# Patient Record
Sex: Male | Born: 1955 | Hispanic: No | Smoking: Never smoker
Health system: Southern US, Community
[De-identification: ages and names within clinical notes are randomized; demographics above are authoritative.]

## PROBLEM LIST (undated history)

## (undated) ENCOUNTER — Emergency Department (HOSPITAL_COMMUNITY): Payer: Medicaid Other | Source: Home / Self Care

## (undated) DIAGNOSIS — R569 Unspecified convulsions: Secondary | ICD-10-CM

## (undated) DIAGNOSIS — F513 Sleepwalking [somnambulism]: Secondary | ICD-10-CM

## (undated) DIAGNOSIS — M1990 Unspecified osteoarthritis, unspecified site: Secondary | ICD-10-CM

---

## 2015-11-12 ENCOUNTER — Emergency Department (HOSPITAL_COMMUNITY)
Admission: EM | Admit: 2015-11-12 | Discharge: 2015-11-12 | Disposition: A | Discharge status: Home / Self Care | Payer: Self-pay | Attending: Emergency Medicine | Admitting: Emergency Medicine

## 2015-11-12 ENCOUNTER — Encounter (HOSPITAL_COMMUNITY): Payer: Self-pay | Admitting: *Deleted

## 2015-11-12 DIAGNOSIS — Z79899 Other long term (current) drug therapy: Secondary | ICD-10-CM | POA: Insufficient documentation

## 2015-11-12 DIAGNOSIS — M79652 Pain in left thigh: Secondary | ICD-10-CM | POA: Insufficient documentation

## 2015-11-12 HISTORY — DX: Sleepwalking (somnambulism): F51.3

## 2015-11-12 HISTORY — DX: Unspecified osteoarthritis, unspecified site: M19.90

## 2015-11-12 MED ORDER — INDOMETHACIN 50 MG PO CAPS: 50.0000 mg | ORAL_CAPSULE | Freq: Two times a day (BID) | ORAL | Status: DC | PRN

## 2015-11-12 NOTE — ED Provider Notes (Signed)
CSN: [id number]     Arrival date & time 11/12/15  1049 History  By signing my name below, I, Michael Shea, attest that this documentation has been prepared under the direction and in the presence of [facility] Pacey Altizer, PA-C. Electronically Signed: Phillis HaggisGabriella Shea, ED Scribe. 11/12/2015. 11:42 AM.   Chief Complaint  Patient presents with  . Groin Pain   The history is provided by the patient. No language interpreter was used.  HPI Comments: Michael Shea is a 60 y.o. male with a hx of arthritis who presents to the Emergency Department complaining of left groin pain onset 8 months ago. Pt has completed physical therapy for this pain to relief. He states that if he misses one day, the pain will come back and worsen. He is able to ambulate but states that it is painful. He states that it has been recommended that he get hip surgery but he does not think he will receive it. He last saw his PCP one month ago. Pt takes Indomethacin for his pain at home to relief, but he has since run out. He denies injury to the area, abdominal pain, joint swelling, rash, wound, weakness, or numbness.   Past Medical History  Diagnosis Date  . Sleep walking   . Arthritis    History reviewed. No pertinent past surgical history. No family history on file. Social History  Substance Use Topics  . Smoking status: Never Smoker   . Smokeless tobacco: None  . Alcohol Use: No    Review of Systems  Gastrointestinal: Negative for abdominal pain.  Musculoskeletal: Positive for myalgias and arthralgias. Negative for joint swelling.  Skin: Negative for rash and wound.  Neurological: Negative for weakness and numbness.   Allergies  Review of patient's allergies indicates no known allergies.  Home Medications   Prior to Admission medications   Medication Sig Start Date End Date Taking? Authorizing Provider  indomethacin (INDOCIN) 50 MG capsule Take 1 capsule (50 mg total) by mouth 2 (two) times daily as needed for moderate  pain. 11/12/15   Michael Hagood Pilcher Caymen Dubray, PA-C   BP 140/87 mmHg  Pulse 62  Temp(Src) 97.8 F (36.6 C) (Oral)  Resp 20  SpO2 99% Physical Exam  Constitutional: He is oriented to person, place, and time. He appears well-developed and well-nourished.  HENT:  Head: Normocephalic and atraumatic.  Neck: Normal range of motion. Neck supple.  Cardiovascular: Normal rate, regular rhythm and normal heart sounds.   Pulmonary/Chest: Effort normal and breath sounds normal. No respiratory distress.  Abdominal: Soft. He exhibits no distension. There is no tenderness.  Genitourinary:  Chaperone present during exam. No sign of inguinal hernia. No groin tenderness.  Musculoskeletal:       Legs: TTP as depicted in image. No erythema, ecchymosis, edema, or deformity appreciated. 5/5 muscle strength in bilateral LE's.   Neurological: He is alert and oriented to person, place, and time.  Bilateral lower extremities neurovascularly intact.   Skin: Skin is warm and dry.  Psychiatric: He has a normal mood and affect. His behavior is normal.  Nursing note and vitals reviewed.   ED Course  Procedures (including critical care time) DIAGNOSTIC STUDIES: Oxygen Saturation is 100% on RA, normal by my interpretation.    COORDINATION OF CARE: 11:42 AM-Discussed treatment plan which includes prescription for Indomethacin with pt at bedside and pt agreed to plan.    Labs Review Labs Reviewed - No data to display  Imaging Review No results found. I have personally reviewed and evaluated  these images and lab results as part of my medical decision-making.   EKG Interpretation None      MDM   Final diagnoses:  Pain of left thigh   Michael Shea presents to ED for "groin pain". On exam, patient's area of pain is more his anterior thigh. There is no tenderness to palpation of the groin and no sign of hernia. Likely muscular injury. Patient is ambulatory in the ED. He is already undergoing physical therapy for  his symptoms which she states is helping. He is requesting a refill of his indomethacin for pain relief, which I have filled today. I encouraged him to follow up with his regular doctor and discussed medication refills in ED. Return precautions discussed and all questions answered.  I personally performed the services described in this documentation, which was scribed in my presence. The recorded information has been reviewed and is accurate.     [facility] Michael Couillard, PA-C 11/12/15 1425  Michael Kaplan, MD 11/13/15 [phone]

## 2015-11-12 NOTE — ED Notes (Signed)
Pt c/o L groin pain onset x 8 mths, pt reports taking PT for this in Dec 2016, pt ambulatory with pain, pt denies swelling, denies injury, pt A&O x4

## 2015-11-12 NOTE — Discharge Instructions (Signed)
Take medication only as needed for severe pain.  Continue going to physical therapy.  Follow up with your primary care provider in regard's to today visit.  Return to ER for new or worsening symptoms, any additional concerns.

## 2015-12-03 ENCOUNTER — Ambulatory Visit: Payer: Self-pay | Attending: Internal Medicine | Admitting: Physician Assistant

## 2015-12-03 VITALS — BP 158/83 | HR 62 | Temp 98.4°F | Ht 69.0 in | Wt 226.8 lb

## 2015-12-03 DIAGNOSIS — M79652 Pain in left thigh: Secondary | ICD-10-CM | POA: Insufficient documentation

## 2015-12-03 DIAGNOSIS — M1990 Unspecified osteoarthritis, unspecified site: Secondary | ICD-10-CM | POA: Insufficient documentation

## 2015-12-03 DIAGNOSIS — Z79899 Other long term (current) drug therapy: Secondary | ICD-10-CM | POA: Insufficient documentation

## 2015-12-03 DIAGNOSIS — M25552 Pain in left hip: Secondary | ICD-10-CM

## 2015-12-03 DIAGNOSIS — Z9889 Other specified postprocedural states: Secondary | ICD-10-CM | POA: Insufficient documentation

## 2015-12-03 DIAGNOSIS — R569 Unspecified convulsions: Secondary | ICD-10-CM | POA: Insufficient documentation

## 2015-12-03 MED ORDER — MELOXICAM 15 MG PO TABS: 15.0000 mg | ORAL_TABLET | Freq: Every day | ORAL | 0 refills | Status: DC

## 2015-12-03 MED ORDER — THERA-GESIC 1-15 % EX CREA: 1.0000 | TOPICAL_CREAM | Freq: Two times a day (BID) | CUTANEOUS | 3 refills | Status: DC

## 2015-12-03 MED ORDER — SM CALCIUM/VITAMIN D3 600-800 MG-UNIT PO TABS: 1.0000 | ORAL_TABLET | Freq: Two times a day (BID) | ORAL | 1 refills | Status: DC

## 2015-12-03 MED ORDER — PHENYTOIN SODIUM EXTENDED 100 MG PO CAPS: 300.0000 mg | ORAL_CAPSULE | Freq: Every day | ORAL | 1 refills | Status: DC
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
## 2015-12-03 NOTE — Progress Notes (Signed)
Medication refill

## 2015-12-06 MED FILL — ?PHENYTOIN SOD EXT 100 MG C: 100 | 30 days supply | Qty: 90 | Fill #0

## 2015-12-13 ENCOUNTER — Encounter (HOSPITAL_COMMUNITY): Payer: Self-pay | Admitting: *Deleted

## 2015-12-13 ENCOUNTER — Emergency Department (HOSPITAL_COMMUNITY)
Admission: EM | Admit: 2015-12-13 | Discharge: 2015-12-13 | Disposition: A | Discharge status: Home / Self Care | Payer: Medicaid Other | Attending: Emergency Medicine | Admitting: Emergency Medicine

## 2015-12-13 DIAGNOSIS — Z79899 Other long term (current) drug therapy: Secondary | ICD-10-CM | POA: Insufficient documentation

## 2015-12-13 DIAGNOSIS — M25552 Pain in left hip: Secondary | ICD-10-CM

## 2015-12-13 MED FILL — MELOXICAM 15 MG TABLET: 15 | 90 days supply | Qty: 90 | Fill #0

## 2015-12-13 NOTE — ED Triage Notes (Signed)
PT reports on going Lt hip pain and needs surgery to hip.  Pt reports [place] moved from [place]. And needs a PCP.

## 2015-12-13 NOTE — ED Provider Notes (Signed)
[facility]-EMERGENCY DEPT Provider Note   CSN: [id number] Arrival date & time: 12/13/15  [id number]  First Provider Contact:  First MD Initiated Contact with Patient 12/13/15 0848        History   Chief Complaint No chief complaint on file.   HPI Dolan Amenlberto Rodden is a 60 y.o. male.  Pt comes in with c/o of ongoing left hip pain. [facility] and hasn't set up any care. He states that he was told that he need to have surgery but he opted to try physical therapy which was working well. He has not had a recent injury. Denies numbness or weakness   The history is provided by the patient. No language interpreter was used.    Past Medical History:  Diagnosis Date  . Arthritis   . Sleep walking     There are no active problems to display for this patient.   No past surgical history on file.     Home Medications    Prior to Admission medications   Medication Sig Start Date End Date Taking? Authorizing Provider  Calcium Carbonate-Vit D-Min (SM CALCIUM/VITAMIN D3) 600-800 MG-UNIT TABS Take 1 tablet by mouth 2 (two) times daily. 12/03/15   Anders SimmondsAngela M McClung, PA-C  meloxicam (MOBIC) 15 MG tablet Take 1 tablet (15 mg total) by mouth daily. 12/03/15   Anders SimmondsAngela M McClung, PA-C  Menthol-Methyl Salicylate (THERA-GESIC) 1-15 % CREA Apply 1 Dose topically 2 (two) times daily. 12/03/15   Anders SimmondsAngela M McClung, PA-C  phenytoin (DILANTIN) 100 MG ER capsule Take 3 capsules (300 mg total) by mouth at bedtime. 12/03/15   Anders SimmondsAngela M McClung, PA-C    Family History No family history on file.  Social History Social History  Substance Use Topics  . Smoking status: Never Smoker  . Smokeless tobacco: Never Used  . Alcohol use No     Allergies   Review of patient's allergies indicates no known allergies.   Review of Systems Review of Systems  All other systems reviewed and are negative.    Physical Exam Updated Vital Signs There were no vitals taken for this visit.  Physical Exam    Constitutional: He appears well-developed and well-nourished.  Cardiovascular: Normal rate.   Pulmonary/Chest: Effort normal and breath sounds normal.  Musculoskeletal: Normal range of motion.  Tender in the left lateral hip. Full rom. No swelling or deformity to the left leg  Neurological: He is alert.  Skin: Skin is warm.  Psychiatric: He has a normal mood and affect.     ED Treatments / Results  Labs (all labs ordered are listed, but only abnormal results are displayed) Labs Reviewed - No data to display  EKG  EKG Interpretation None       Radiology No results found.  Procedures Procedures (including critical care time)  Medications Ordered in ED Medications - No data to display   Initial Impression / Assessment and Plan / ED Course  I have reviewed the triage vital signs and the nursing notes.  Pertinent labs & imaging results that were available during my care of the patient were reviewed by me and considered in my medical decision making (see chart for details).  Clinical Course    Chronic pain. Discussed follow up with pcp or ortho  Final Clinical Impressions(s) / ED Diagnoses   Final diagnoses:  None    New Prescriptions New Prescriptions   No medications on file     Teressa LowerVrinda Weda Baumgarner, NP 12/13/15 [phone]  Raeford RazorStephen Kohut, MD 12/17/15 1122

## 2015-12-13 NOTE — ED Triage Notes (Signed)
[facility]  For orange card referral for PT .  Message left.

## 2015-12-16 ENCOUNTER — Encounter: Payer: Self-pay | Admitting: Physician Assistant

## 2015-12-16 ENCOUNTER — Ambulatory Visit: Payer: Self-pay | Attending: Internal Medicine | Admitting: Physician Assistant

## 2015-12-16 DIAGNOSIS — G8929 Other chronic pain: Secondary | ICD-10-CM | POA: Insufficient documentation

## 2015-12-16 DIAGNOSIS — M25552 Pain in left hip: Secondary | ICD-10-CM | POA: Insufficient documentation

## 2015-12-16 DIAGNOSIS — G40909 Epilepsy, unspecified, not intractable, without status epilepticus: Secondary | ICD-10-CM | POA: Insufficient documentation

## 2015-12-16 DIAGNOSIS — R569 Unspecified convulsions: Secondary | ICD-10-CM

## 2015-12-16 LAB — CBC WITH DIFFERENTIAL/PLATELET
BASOS PCT: 0 %
Basophils Absolute: 0 cells/uL (ref 0–200)
EOS ABS: 77 {cells}/uL (ref 15–500)
Eosinophils Relative: 1 %
HEMATOCRIT: 42.9 % (ref 38.5–50.0)
HEMOGLOBIN: 14.8 g/dL (ref 13.2–17.1)
LYMPHS ABS: 2079 {cells}/uL (ref 850–3900)
Lymphocytes Relative: 27 %
MCH: 31 pg (ref 27.0–33.0)
MCHC: 34.5 g/dL (ref 32.0–36.0)
MCV: 89.9 fL (ref 80.0–100.0)
MONO ABS: 770 {cells}/uL (ref 200–950)
MPV: 10.8 fL (ref 7.5–12.5)
Monocytes Relative: 10 %
NEUTROS ABS: 4774 {cells}/uL (ref 1500–7800)
Neutrophils Relative %: 62 %
Platelets: 246 10*3/uL (ref 140–400)
RBC: 4.77 MIL/uL (ref 4.20–5.80)
RDW: 14 % (ref 11.0–15.0)
WBC: 7.7 10*3/uL (ref 3.8–10.8)

## 2015-12-16 LAB — COMPREHENSIVE METABOLIC PANEL
ALK PHOS: 88 U/L (ref 40–115)
ALT: 14 U/L (ref 9–46)
AST: 15 U/L (ref 10–35)
Albumin: 4.3 g/dL (ref 3.6–5.1)
BILIRUBIN TOTAL: 0.3 mg/dL (ref 0.2–1.2)
BUN: 20 mg/dL (ref 7–25)
CALCIUM: 9.4 mg/dL (ref 8.6–10.3)
CO2: 21 mmol/L (ref 20–31)
Chloride: 107 mmol/L (ref 98–110)
Creat: 0.65 mg/dL — ABNORMAL LOW (ref 0.70–1.33)
GLUCOSE: 88 mg/dL (ref 65–99)
POTASSIUM: 4.3 mmol/L (ref 3.5–5.3)
Sodium: 139 mmol/L (ref 135–146)
TOTAL PROTEIN: 7.1 g/dL (ref 6.1–8.1)

## 2015-12-16 NOTE — Progress Notes (Signed)
Pt states he is here for the following:  Emergency Room follow up for Left hip pain Medication refill
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
## 2015-12-18 LAB — PHENYTOIN LEVEL, FREE AND TOTAL: Phenytoin, Free: 1.4 mg/L (ref 1.0–2.0)

## 2015-12-22 ENCOUNTER — Telehealth: Payer: Self-pay

## 2015-12-22 NOTE — Telephone Encounter (Signed)
Contacted pt to go over lab results pt did not answer lvm for pt to give me a call back at his earliest convenience  

## 2015-12-23 ENCOUNTER — Telehealth: Payer: Self-pay

## 2015-12-23 NOTE — Telephone Encounter (Signed)
Contacted pt to go over lab results pt did not answer lvm informing pt to give me a call back at the office also I will be mailing letter out today

## 2016-02-29 ENCOUNTER — Ambulatory Visit: Payer: Medicaid Other

## 2016-02-29 ENCOUNTER — Ambulatory Visit
Admission: EM | Admit: 2016-02-29 | Discharge: 2016-02-29 | Disposition: A | Discharge status: Home / Self Care | Payer: Medicaid Other | Attending: Family Medicine | Admitting: Family Medicine

## 2016-02-29 DIAGNOSIS — M25552 Pain in left hip: Secondary | ICD-10-CM

## 2016-02-29 DIAGNOSIS — M1652 Unilateral post-traumatic osteoarthritis, left hip: Secondary | ICD-10-CM

## 2016-02-29 DIAGNOSIS — M1612 Unilateral primary osteoarthritis, left hip: Secondary | ICD-10-CM | POA: Insufficient documentation

## 2016-02-29 MED ORDER — MELOXICAM 15 MG PO TABS: 15.0000 mg | ORAL_TABLET | Freq: Every day | ORAL | 0 refills | Status: DC

## 2016-02-29 NOTE — Discharge Instructions (Signed)
Recommend that you follow-up with an orthopedic surgeon for possible total hip replacement. In the meantime use Mobic for pain and also a cane or a crutch in the opposite hand to help relieve the pain in the left hip.

## 2016-02-29 NOTE — ED Triage Notes (Signed)
Pt c/o hip injury that has been reoccurring since a personal [place] about 2 years ago.
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
## 2016-03-13 MED FILL — ?PHENYTOIN SOD EXT 100 MG C: 100 | 30 days supply | Qty: 90 | Fill #1

## 2016-03-21 ENCOUNTER — Telehealth: Payer: Self-pay | Admitting: *Deleted

## 2016-03-21 NOTE — Telephone Encounter (Signed)
Called and left pt a message to inform him that his prescription is ready to be picked up and if he can pick it up by Monday 11/27 before 3pm.

## 2016-07-04 ENCOUNTER — Telehealth: Payer: Self-pay

## 2016-07-04 NOTE — Telephone Encounter (Signed)
TC  To  NP  At  [facility] regarding concerns  Voiced by  Case management. And resident. Client has on two occassions woke up at night  And had usual behaviors  As spitting on another clients  Bed and  Wiping it  Every where and on another  Occasion was walking I in the hallway with a sheet wrapped  Around him. When ask the  Next  Day what he  Was doing  He  Expressed  Sorrow  And couldn't remember the behavior. Is he sleep  Walking ?? Nurse called for  Advice on client ,NP will look at  Medications  Client is  Taking to see if they  Could be  Causing some behaviors.Client is on Dilantin ??  NP to follow  Up.

## 2016-07-12 NOTE — Congregational Nurse Program (Signed)
Congregational Nurse Program Note  Date of Encounter: 07/12/2016  Past Medical History: Past Medical History:  Diagnosis Date  . Arthritis   . Sleep walking     Encounter Details:     CNP Questionnaire - 07/12/16 2001      Patient Demographics   Is this a new or existing patient? Existing   Patient is considered a/an Not Applicable   Race Other     Patient Assistance   Location of Patient Assistance Not Applicable   Patient's financial/insurance status Self-Pay (Uninsured)   Uninsured Patient ([place] Card/Care Connects) Yes   Interventions Counseled to make appt. with provider   Patient referred to apply for the following financial assistance Not Applicable   Food insecurities addressed Not Applicable   Transportation assistance No   Assistance securing medications No   Educational health offerings Acute disease     Encounter Details   Primary purpose of visit Acute Illness/Condition Visit;Navigating the Healthcare System;Spiritual Care/Support Visit;Education/Health Concerns   Was an Emergency Department visit averted? Not Applicable   Does patient have a medical provider? Yes   Patient referred to Follow up with established PCP   Was a mental health screening completed? (GAINS tool) No   Does patient have dental issues? No   Does patient have vision issues? No   Does your patient have an abnormal blood pressure today? No   Since previous encounter, have you referred patient for abnormal blood pressure that resulted in a new diagnosis or medication change? No   Does your patient have an abnormal blood glucose today? No   Since previous encounter, have you referred patient for abnormal blood glucose that resulted in a new diagnosis or medication change? No   Was there a life-saving intervention made? No    Client in with  Questions regarding his  Tongue  Hurting ,states he ahs gargled  Several times but no better  For  Several days . Nurse looked at his tongue and it   appeared  Okay . Suggested he  Continue his  gargles if  not  better in a few days see his  PCP.

## 2016-08-02 NOTE — Congregational Nurse Program (Signed)
Congregational Nurse Program Note  Date of Encounter: 08/01/2016  Past Medical History: Past Medical History:  Diagnosis Date  . Arthritis   . Sleep walking     Encounter Details:     CNP Questionnaire - 08/02/16 1921      Patient Demographics   Is this a new or existing patient? Existing   Patient is considered a/an Not Applicable   Race Latino/Hispanic     Patient Assistance   Location of Patient Assistance Not Applicable   Patient's financial/insurance status Self-Pay (Uninsured)   Uninsured Patient ([place] Card/Care Connects) Yes   Interventions Counseled to make appt. with provider   Patient referred to apply for the following financial assistance Not Applicable   Food insecurities addressed Not Applicable   Transportation assistance No   Assistance securing medications No   Educational health offerings Behavioral health;Interpersonal relationships     Encounter Details   Primary purpose of visit Education/Health Concerns;Spiritual Care/Support Visit   Was an Emergency Department visit averted? Not Applicable   Does patient have a medical provider? Yes   Patient referred to Clinic;Follow up with established PCP   Was a mental health screening completed? (GAINS tool) No   Does patient have dental issues? No   Does patient have vision issues? No   Does your patient have an abnormal blood pressure today? No   Since previous encounter, have you referred patient for abnormal blood pressure that resulted in a new diagnosis or medication change? No   Does your patient have an abnormal blood glucose today? No   Since previous encounter, have you referred patient for abnormal blood glucose that resulted in a new diagnosis or medication change? No   Was there a life-saving intervention made? No    Nurse called client in to discuss his  Night time behaviors  And the need to see the NP at  [facility] regarding his behaviors that are disturbing to other residents. Client was very  co-operative and understood that  his  behaviors need to be addressed.  Client is on Dilantin  Which can cause some mental confusion?? Not sure what is  causing  client to wake up and night and start spitting  across the room and at other residents. . NP had agreed to see client ,referral made . Follow  As needed

## 2016-08-23 DIAGNOSIS — R42 Dizziness and giddiness: Secondary | ICD-10-CM

## 2016-08-23 LAB — GLUCOSE, POCT (MANUAL RESULT ENTRY)

## 2016-08-23 NOTE — Congregational Nurse Program (Signed)
Congregational Nurse Program Note  Date of Encounter: 08/23/2016  Past Medical History: Past Medical History:  Diagnosis Date  . Arthritis   . Sleep walking     Encounter Details:     CNP Questionnaire - 08/23/16 1854      Patient Demographics   Is this a new or existing patient? Existing   Patient is considered a/an Not Applicable   Race Latino/Hispanic     Patient Assistance   Location of Patient Assistance Not Applicable   Patient's financial/insurance status Self-Pay (Uninsured)   Uninsured Patient ([place] Card/Care Connects) Yes   Interventions Follow-up/Education/Support provided after completed appt.   Patient referred to apply for the following financial assistance Not Applicable   Food insecurities addressed Not Applicable   Transportation assistance No   Assistance securing medications No   Educational health offerings Hypertension;Medications     Encounter Details   Primary purpose of visit Education/Health Concerns;Spiritual Care/Support Visit   Was an Emergency Department visit averted? Not Applicable   Does patient have a medical provider? Yes   Patient referred to Clinic;Follow up with established PCP   Was a mental health screening completed? (GAINS tool) No   Does patient have dental issues? No   Does patient have vision issues? No   Does your patient have an abnormal blood pressure today? Yes   Since previous encounter, have you referred patient for abnormal blood pressure that resulted in a new diagnosis or medication change? No   Does your patient have an abnormal blood glucose today? No   Since previous encounter, have you referred patient for abnormal blood glucose that resulted in a new diagnosis or medication change? No   Was there a life-saving intervention made? No    In today stating he has been dizzy on several occassions  . States his  Father had  Diabetes . Pont's  to his  Stomach  May  Be something  There??  Did not  Go  To  See NP  For his   Other problems with  Spitting at  Night ,denies any  Of  Those problems. Blood  Sugar  Normal  , blood  Pressure puts  Him at  Hypertension.  To see NP  At  [facility]  In May  Not  Sure of  Date  But will call or  Stop by there to see. Will monitor  Here . Client is  About  To  Be  Housed per  Case managers. Counseled  Regarding  High blood  pressure

## 2016-10-02 ENCOUNTER — Ambulatory Visit: Payer: Medicaid Other | Admitting: Internal Medicine

## 2016-10-09 ENCOUNTER — Encounter: Payer: Self-pay | Admitting: Family Medicine

## 2016-10-09 ENCOUNTER — Ambulatory Visit: Payer: Self-pay | Attending: Internal Medicine | Admitting: Family Medicine

## 2016-10-09 VITALS — BP 126/76 | HR 84 | Temp 98.0°F | Resp 18 | Ht 67.0 in | Wt 226.0 lb

## 2016-10-09 DIAGNOSIS — M175 Other unilateral secondary osteoarthritis of knee: Secondary | ICD-10-CM

## 2016-10-09 DIAGNOSIS — F513 Sleepwalking [somnambulism]: Secondary | ICD-10-CM | POA: Insufficient documentation

## 2016-10-09 DIAGNOSIS — M1990 Unspecified osteoarthritis, unspecified site: Secondary | ICD-10-CM | POA: Insufficient documentation

## 2016-10-09 DIAGNOSIS — Z1321 Encounter for screening for nutritional disorder: Secondary | ICD-10-CM

## 2016-10-09 DIAGNOSIS — R569 Unspecified convulsions: Secondary | ICD-10-CM | POA: Insufficient documentation

## 2016-10-09 DIAGNOSIS — M1711 Unilateral primary osteoarthritis, right knee: Secondary | ICD-10-CM | POA: Insufficient documentation

## 2016-10-09 MED ORDER — MELOXICAM 15 MG PO TABS: 15.0000 mg | ORAL_TABLET | Freq: Every day | ORAL | 3 refills | Status: DC

## 2016-10-09 MED ORDER — PHENYTOIN SODIUM EXTENDED 100 MG PO CAPS
300.0000 mg | ORAL_CAPSULE | Freq: Every day | ORAL | 3 refills | Status: DC
Start: 2016-10-09 — End: 2017-02-27

## 2016-10-09 MED FILL — PHENYTOIN SOD EXT 100 MG CA: 100 | 30 days supply | Qty: 90 | Fill #0

## 2016-10-09 MED FILL — MELOXICAM 15 MG TABLET: 15 | 30 days supply | Qty: 30 | Fill #0

## 2016-10-09 NOTE — Progress Notes (Signed)
Patient is here for Arthritis   Patient complains of joint pain being present and scaled at a 9 currently.  Patient has taken medication today Patient has not eaten today.

## 2016-10-09 NOTE — Progress Notes (Signed)
Subjective:  Patient ID: Michael Shea, male    DOB: Oct 11, 1955  Age: 61 y.o. MRN: [id number]  CC: Arthritis   HPI Michael Shea is a 61 year old male with a history of seizures, osteoarthritis who was previously followed at the [facility] and presents today for follow-up visit. He endorses compliance with his Dilantin and states his last seizure was in 2004.  He complains of right knee pain as a result of running out of meloxicam which helps relieve his symptoms. He uses a knee brace continuously.  Past Medical History:  Diagnosis Date  . Arthritis   . Sleep walking     History reviewed. No pertinent surgical history.  No Known Allergies  Outpatient Medications Prior to Visit  Medication Sig Dispense Refill  . Menthol-Methyl Salicylate (THERA-GESIC) 1-15 % CREA Apply 1 Dose topically 2 (two) times daily. 1 Tube 3  . Calcium Carbonate-Vit D-Min (SM CALCIUM/VITAMIN D3) 600-800 MG-UNIT TABS Take 1 tablet by mouth 2 (two) times daily. 180 tablet 1  . meloxicam (MOBIC) 15 MG tablet Take 1 tablet (15 mg total) by mouth daily. 30 tablet 0  . phenytoin (DILANTIN) 100 MG ER capsule Take 3 capsules (300 mg total) by mouth at bedtime. 90 capsule 1   No facility-administered medications prior to visit.     ROS Review of Systems  Constitutional: Negative for activity change and appetite change.  HENT: Negative for sinus pressure and sore throat.   Eyes: Negative for visual disturbance.  Respiratory: Negative for cough, chest tightness and shortness of breath.   Cardiovascular: Negative for chest pain and leg swelling.  Gastrointestinal: Negative for abdominal distention, abdominal pain, constipation and diarrhea.  Endocrine: Negative.   Genitourinary: Negative for dysuria.  Musculoskeletal:       See hpi  Skin: Negative for rash.  Allergic/Immunologic: Negative.   Neurological: Negative for weakness, light-headedness and numbness.  Psychiatric/Behavioral: Negative for dysphoric mood and  suicidal ideas.    Objective:  BP 126/76 (BP Location: Left Arm, Patient Position: Sitting, Cuff Size: Normal)   Pulse 84   Temp 98 F (36.7 C) (Oral)   Resp 18   Ht _0  (1.702 m)   Wt 226 lb (102.5 kg)   SpO2 100%   BMI 35.40 kg/m   BP/Weight 10/09/2016 08/23/2016 93/73/4287  Systolic BP 681 157 262  Diastolic BP 76 81 68  Wt. (Lbs) 226 - 210  BMI 35.4 - 31.01      Physical Exam  Constitutional: He is oriented to person, place, and time. He appears well-developed and well-nourished.  Cardiovascular: Normal rate, normal heart sounds and intact distal pulses.   No murmur heard. Pulmonary/Chest: Effort normal and breath sounds normal. He has no wheezes. He has no rales. He exhibits no tenderness.  Abdominal: Soft. Bowel sounds are normal. He exhibits no distension and no mass. There is no tenderness.  Musculoskeletal: He exhibits tenderness (slight tenderness and crepitus on flexion and extension of the right knee joint; left is normal). He exhibits no edema.  Neurological: He is alert and oriented to person, place, and time.  Skin: Skin is warm and dry.  Psychiatric: He has a normal mood and affect.     Assessment & Plan:   1. Seizures (HCC) Stable - phenytoin (DILANTIN) 100 MG ER capsule; Take 3 capsules (300 mg total) by mouth at bedtime.  Dispense: 90 capsule; Refill: 3 - Lipid panel - CMP14+EGFR - Phenytoin level, free and total  2. Other secondary osteoarthritis of right knee Uncontrolled  Refill meloxicam Continue knee brace Advised that weight loss will improve symptoms - meloxicam (MOBIC) 15 MG tablet; Take 1 tablet (15 mg total) by mouth daily.  Dispense: 30 tablet; Refill: 3   Meds ordered this encounter  Medications  . meloxicam (MOBIC) 15 MG tablet    Sig: Take 1 tablet (15 mg total) by mouth daily.    Dispense:  30 tablet    Refill:  3  . phenytoin (DILANTIN) 100 MG ER capsule    Sig: Take 3 capsules (300 mg total) by mouth at bedtime.     Dispense:  90 capsule    Refill:  3    Follow-up: Return in about 3 months (around 01/09/2017) for Follow-up of chronic medical conditions.   This note has been created with Surveyor, quantity. Any transcriptional errors are unintentional.     Arnoldo Morale MD

## 2016-10-09 NOTE — Addendum Note (Signed)
Addended by: Jaclyn ShaggyAMAO, Eziah Negro on: 10/09/2016 02:49 PM   Modules accepted: Orders

## 2016-10-09 NOTE — Patient Instructions (Signed)

## 2016-10-10 ENCOUNTER — Other Ambulatory Visit: Payer: Self-pay | Admitting: Family Medicine

## 2016-10-10 LAB — VITAMIN B12: VITAMIN B 12: 254 pg/mL (ref 232–1245)

## 2016-10-10 LAB — VITAMIN D 25 HYDROXY (VIT D DEFICIENCY, FRACTURES): Vit D, 25-Hydroxy: 21.3 ng/mL — ABNORMAL LOW (ref 30.0–100.0)

## 2016-10-10 MED ORDER — ERGOCALCIFEROL 1.25 MG (50000 UT) PO CAPS: 50000.0000 [IU] | ORAL_CAPSULE | ORAL | 1 refills | Status: DC

## 2016-10-10 MED ORDER — ATORVASTATIN CALCIUM 20 MG PO TABS: 20.0000 mg | ORAL_TABLET | Freq: Every day | ORAL | 3 refills | Status: DC

## 2016-10-10 MED FILL — ?ATORVASTATIN 20 MG TABLET: 20 | 30 days supply | Qty: 30 | Fill #0

## 2016-10-10 MED FILL — VIT D2 1.25 MG (50,000 UNIT: 1.25 MG | 63 days supply | Qty: 9 | Fill #0

## 2016-10-11 ENCOUNTER — Telehealth: Payer: Self-pay | Admitting: Family Medicine

## 2016-10-11 MED ORDER — CETIRIZINE HCL 10 MG PO TABS: 10.0000 mg | ORAL_TABLET | Freq: Every day | ORAL | 1 refills | Status: DC

## 2016-10-11 MED FILL — ?CETIRIZINE HCL 10 MG TABLE: 10 | 30 days supply | Qty: 30 | Fill #0

## 2016-10-11 NOTE — Telephone Encounter (Signed)
Zyrtec sent to his pharmacy. 

## 2016-10-11 NOTE — Telephone Encounter (Signed)
Patient is requesting a prescription for allergy medicine. He states he communicated this with pcp during his office visit.  Please advised

## 2016-10-14 LAB — CMP14+EGFR
ALT: 19 IU/L (ref 0–44)
AST: 18 IU/L (ref 0–40)
Albumin/Globulin Ratio: 1.7 (ref 1.2–2.2)
Albumin: 4.8 g/dL (ref 3.5–5.5)
Alkaline Phosphatase: 126 IU/L — ABNORMAL HIGH (ref 39–117)
BUN/Creatinine Ratio: 21 — ABNORMAL HIGH (ref 9–20)
BUN: 15 mg/dL (ref 6–24)
Bilirubin Total: 0.4 mg/dL (ref 0.0–1.2)
CHLORIDE: 103 mmol/L (ref 96–106)
CO2: 23 mmol/L (ref 20–29)
Calcium: 9.5 mg/dL (ref 8.7–10.2)
Creatinine, Ser: 0.7 mg/dL — ABNORMAL LOW (ref 0.76–1.27)
GFR, EST AFRICAN AMERICAN: 119 mL/min/{1.73_m2} (ref >59)
GFR, EST NON AFRICAN AMERICAN: 103 mL/min/{1.73_m2} (ref >59)
GLUCOSE: 100 mg/dL — AB (ref 65–99)
Globulin, Total: 2.9 g/dL (ref 1.5–4.5)
Potassium: 4.5 mmol/L (ref 3.5–5.2)
Sodium: 140 mmol/L (ref 134–144)
TOTAL PROTEIN: 7.7 g/dL (ref 6.0–8.5)

## 2016-10-14 LAB — PHENYTOIN LEVEL, FREE AND TOTAL
Phenytoin, Free: 1 ug/mL (ref 1.0–2.0)
Phenytoin, Serum: 18.4 ug/mL (ref 10.0–20.0)

## 2016-10-14 LAB — LIPID PANEL
CHOL/HDL RATIO: 7 ratio — AB (ref 0.0–5.0)
Cholesterol, Total: 274 mg/dL — ABNORMAL HIGH (ref 100–199)
HDL: 39 mg/dL — ABNORMAL LOW (ref >39)
LDL Calculated: 192 mg/dL — ABNORMAL HIGH (ref 0–99)
Triglycerides: 217 mg/dL — ABNORMAL HIGH (ref 0–149)
VLDL Cholesterol Cal: 43 mg/dL — ABNORMAL HIGH (ref 5–40)

## 2016-10-17 NOTE — Congregational Nurse Program (Signed)
Congregational Nurse Program Note  Date of Encounter: 10/17/2016  Past Medical History: Past Medical History:  Diagnosis Date  . Arthritis   . Sleep walking     Encounter Details:     CNP Questionnaire - 10/17/16 1721      Patient Demographics   Is this a new or existing patient? Existing   Patient is considered a/an Not Applicable   Race Latino/Hispanic     Patient Assistance   Location of Patient Assistance Not Applicable   Patient's financial/insurance status Self-Pay (Uninsured)   Uninsured Patient ([place] Card/Care Connects) Yes   Interventions Counseled to make appt. with provider   Patient referred to apply for the following financial assistance [employer]/Care Connects   Transportation assistance Yes   Type of Assistance [employer] Given   Assistance securing medications No   Educational health offerings Behavioral health;Medications;Safety;Interpersonal relationships     Encounter Details   Primary purpose of visit Spiritual Care/Support Visit;Education/Health Concerns;Navigating the Healthcare System   Was an Emergency Department visit averted? Not Applicable   Does patient have a medical provider? Yes   Patient referred to Clinic;Area Agency   Was a mental health screening completed? (GAINS tool) No   Does patient have dental issues? No   Does patient have vision issues? No   Does your patient have an abnormal blood pressure today? No   Since previous encounter, have you referred patient for abnormal blood pressure that resulted in a new diagnosis or medication change? No   Does your patient have an abnormal blood glucose today? No   Since previous encounter, have you referred patient for abnormal blood glucose that resulted in a new diagnosis or medication change? No   Was there a life-saving intervention made? No         Clinical Intake - 10/09/16 1403      Pre-visit preparation   Pre-visit preparation completed Yes     Pain   Pain  0-10   Pain  Score 9    Pain Type Chronic pain   Pain Location Other (Comment)  joints   Pain Descriptors / Indicators Aching   Pain Onset More than a month ago   Pain Frequency Intermittent     Nutrition Screen   Diabetes No     Functional Status   Activities of Daily Living Independent   Ambulation Independent   Medication Administration Independent   Home Management Independent     Abuse/Neglect   Do you feel unsafe in your current relationship? No   Do you feel physically threatened by others? No   Anyone hurting you at home, work, or school? No   Unable to ask? No    Client referred to see nurse because of his  behaviors  During the night . Client appears to be sleep walking . Has gotten up at night walking down the hallway naked when approached becomes  Combative , in am doesn't admit to any  Behavior of that sort .Marland Kitchen. Just this past week end woke up in the bed with another resident playing with the residents feet. These behaviors ar causing conflicts with other residents and can present a safety risk for resident and client. I talked with client today he  States it is a problem ,he doesn't know what to do but realizes he shouldn't be  Doing that . [facility] ,given bus  Passes . Will follow up with  NP

## 2016-10-19 ENCOUNTER — Encounter: Payer: Self-pay | Admitting: Family Medicine

## 2016-10-23 ENCOUNTER — Ambulatory Visit: Payer: Self-pay | Attending: Family Medicine | Admitting: Family Medicine

## 2016-10-23 ENCOUNTER — Encounter: Payer: Self-pay | Admitting: Family Medicine

## 2016-10-23 VITALS — BP 154/69 | HR 61 | Temp 97.6°F | Wt 229.0 lb

## 2016-10-23 DIAGNOSIS — M1990 Unspecified osteoarthritis, unspecified site: Secondary | ICD-10-CM | POA: Insufficient documentation

## 2016-10-23 DIAGNOSIS — R569 Unspecified convulsions: Secondary | ICD-10-CM | POA: Insufficient documentation

## 2016-10-23 DIAGNOSIS — R42 Dizziness and giddiness: Secondary | ICD-10-CM | POA: Insufficient documentation

## 2016-10-23 DIAGNOSIS — Z79899 Other long term (current) drug therapy: Secondary | ICD-10-CM | POA: Insufficient documentation

## 2016-10-23 DIAGNOSIS — M79641 Pain in right hand: Secondary | ICD-10-CM | POA: Insufficient documentation

## 2016-10-23 DIAGNOSIS — R030 Elevated blood-pressure reading, without diagnosis of hypertension: Secondary | ICD-10-CM | POA: Insufficient documentation

## 2016-10-23 MED ORDER — PREDNISONE 20 MG PO TABS: 20.0000 mg | ORAL_TABLET | Freq: Two times a day (BID) | ORAL | 0 refills | Status: DC

## 2016-10-23 MED ORDER — MECLIZINE HCL 25 MG PO TABS: 25.0000 mg | ORAL_TABLET | Freq: Three times a day (TID) | ORAL | 1 refills | Status: DC | PRN

## 2016-10-23 MED FILL — ?PREDNISONE 20 MG TABLET: 20 | 5 days supply | Qty: 10 | Fill #0

## 2016-10-23 MED FILL — TRAVEL SICKNESS 25 MG TAB C: 25 | 20 days supply | Qty: 60 | Fill #0

## 2016-10-23 NOTE — Progress Notes (Signed)
Subjective:  Patient ID: Michael Shea, male    DOB: [birth year]  Age: 61 y.o. MRN: [id number]  CC: Hand pain  HPI Michael Amenlberto Strader s a 61 year old male with a history of seizures, osteoarthritis who [facility] and presents today for follow-up visit. He endorses compliance with his Dilantin and states his last seizure was in 2004.  He complains of a one-month history of right hand pain and inability to completely flex his right hand. He does have a history of underlying osteoarthritis of the knee for which he takes meloxicam with relief in symptoms. Denies swelling of his right hand, weakness or dropping things.  He also complains of dizziness which is worse on waking up in the morning and also associated with change in position. Denies ear pain, nausea or vomiting.    Past Medical History:  Diagnosis Date  . Arthritis   . Sleep walking     No past surgical history on file.  No Known Allergies   Outpatient Medications Prior to Visit  Medication Sig Dispense Refill  . atorvastatin (LIPITOR) 20 MG tablet Take 1 tablet (20 mg total) by mouth daily. 30 tablet 3  . cetirizine (ZYRTEC) 10 MG tablet Take 1 tablet (10 mg total) by mouth daily. 30 tablet 1  . ergocalciferol (DRISDOL) 50000 units capsule Take 1 capsule (50,000 Units total) by mouth once a week. 9 capsule 1  . meloxicam (MOBIC) 15 MG tablet Take 1 tablet (15 mg total) by mouth daily. 30 tablet 3  . Menthol-Methyl Salicylate (THERA-GESIC) 1-15 % CREA Apply 1 Dose topically 2 (two) times daily. 1 Tube 3  . phenytoin (DILANTIN) 100 MG ER capsule Take 3 capsules (300 mg total) by mouth at bedtime. 90 capsule 3   No facility-administered medications prior to visit.     ROS Review of Systems  Constitutional: Negative for activity change and appetite change.  HENT: Negative for sinus pressure and sore throat.   Eyes: Negative for visual disturbance.  Respiratory: Negative for cough, chest tightness and  shortness of breath.   Cardiovascular: Negative for chest pain and leg swelling.  Gastrointestinal: Negative for abdominal distention, abdominal pain, constipation and diarrhea.  Endocrine: Negative.   Genitourinary: Negative for dysuria.  Musculoskeletal:       See history of present illness  Skin: Negative for rash.  Allergic/Immunologic: Negative.   Neurological: Positive for light-headedness. Negative for weakness and numbness.  Psychiatric/Behavioral: Negative for dysphoric mood and suicidal ideas.    Objective:  BP (!) 154/69   Pulse 61   Temp 97.6 F (36.4 C) (Oral)   Wt 229 lb (103.9 kg)   SpO2 99%   BMI 35.87 kg/m   BP/Weight 10/23/2016 10/09/2016 08/23/2016  Systolic BP 154 126 146  Diastolic BP 69 76 81  Wt. (Lbs) 229 226 -  BMI 35.87 35.4 -      Physical Exam  Constitutional: He is oriented to person, place, and time. He appears well-developed and well-nourished.  Cardiovascular: Normal rate, normal heart sounds and intact distal pulses.   No murmur heard. Pulmonary/Chest: Effort normal and breath sounds normal. He has no wheezes. He has no rales. He exhibits no tenderness.  Abdominal: Soft. Bowel sounds are normal. He exhibits no distension and no mass. There is no tenderness.  Musculoskeletal:  unable to make a fist in the right hand with inability to completely flex the medial 2 fingers; attention is normal  Neurological: He is alert and oriented to person, place,  and time.  Skin: Skin is warm and dry.  Psychiatric: He has a normal mood and affect.   Assessment & Plan:   1. Pain of right hand Likely underlying osteoarthritis Placed on short course of prednisone Continue meloxicam after prednisone is completed Reassess at next visit  2. Vertigo Placed on meclizine  3. Elevated blood pressure reading in office without diagnosis of hypertension  possibly NSAID induced We'll reassess at next visit Low-sodium diet   Meds ordered this encounter    Medications  . predniSONE (DELTASONE) 20 MG tablet    Sig: Take 1 tablet (20 mg total) by mouth 2 (two) times daily with a meal.    Dispense:  10 tablet    Refill:  0  . meclizine (ANTIVERT) 25 MG tablet    Sig: Take 1 tablet (25 mg total) by mouth 3 (three) times daily as needed.    Dispense:  60 tablet    Refill:  1    Follow-up: Return in about 1 month (around 11/22/2016) for Follow-up of vertigo and blood pressure.   Jaclyn Shaggy MD

## 2016-10-23 NOTE — Patient Instructions (Signed)
Dizziness Dizziness is a common problem. It is a feeling of unsteadiness or light-headedness. You may feel like you are about to faint. Dizziness can lead to injury if you stumble or fall. Anyone can become dizzy, but dizziness is more common in older adults. This condition can be caused by a number of things, including medicines, dehydration, or illness. Follow these instructions at home: Taking these steps may help with your condition: Eating and drinking   Drink enough fluid to keep your urine clear or pale yellow. This helps to keep you from becoming dehydrated. Try to drink more clear fluids, such as water.  Do not drink alcohol.  Limit your caffeine intake if directed by your health care provider.  Limit your salt intake if directed by your health care provider. Activity   Avoid making quick movements.  Rise slowly from chairs and steady yourself until you feel okay.  In the morning, first sit up on the side of the bed. When you feel okay, stand slowly while you hold onto something until you know that your balance is fine.  Move your legs often if you need to stand in one place for a long time. Tighten and relax your muscles in your legs while you are standing.  Do not drive or operate heavy machinery if you feel dizzy.  Avoid bending down if you feel dizzy. Place items in your home so that they are easy for you to reach without leaning over. Lifestyle   Do not use any tobacco products, including cigarettes, chewing tobacco, or electronic cigarettes. If you need help quitting, ask your health care provider.  Try to reduce your stress level, such as with yoga or meditation. Talk with your health care provider if you need help. General instructions   Watch your dizziness for any changes.  Take medicines only as directed by your health care provider. Talk with your health care provider if you think that your dizziness is caused by a medicine that you are taking.  Tell a friend  or a family member that you are feeling dizzy. If he or she notices any changes in your behavior, have this person call your health care provider.  Keep all follow-up visits as directed by your health care provider. This is important. Contact a health care provider if:  Your dizziness does not go away.  Your dizziness or light-headedness gets worse.  You feel nauseous.  You have reduced hearing.  You have new symptoms.  You are unsteady on your feet or you feel like the room is spinning. Get help right away if:  You vomit or have diarrhea and are unable to eat or drink anything.  You have problems talking, walking, swallowing, or using your arms, hands, or legs.  You feel generally weak.  You are not thinking clearly or you have trouble forming sentences. It may take a friend or family member to notice this.  You have chest pain, abdominal pain, shortness of breath, or sweating.  Your vision changes.  You notice any bleeding.  You have a headache.  You have neck pain or a stiff neck.  You have a fever. This information is not intended to replace advice given to you by your health care provider. Make sure you discuss any questions you have with your health care provider. Document Released: 10/11/2000 Document Revised: 09/23/2015 Document Reviewed: 04/13/2014 Elsevier Interactive Patient Education  2017 [employer]  

## 2016-10-24 NOTE — Congregational Nurse Program (Signed)
Congregational Nurse Program Note  Date of Encounter: 10/18/2016  Past Medical History: Past Medical History:  Diagnosis Date  . Arthritis   . Sleep walking     Encounter Details:     CNP Questionnaire - 10/24/16 1839      Patient Demographics   Is this a new or existing patient? Existing   Patient is considered a/an Not Applicable   Race Latino/Hispanic     Patient Assistance   Location of Patient Assistance Not Applicable   Patient's financial/insurance status Self-Pay (Uninsured)   Uninsured Patient ([place] [facility]/Care Connects) Yes   Interventions Follow-up/Education/Support provided after completed appt.;Counseled to make appt. with provider;Assisted patient in making appt.   Patient referred to apply for the following financial assistance Not Applicable   Food insecurities addressed Not Applicable   Transportation assistance Yes   Type of Assistance Bus Pass Given   Assistance securing medications No   Educational health offerings Behavioral health;Interpersonal relationships;Medications     Encounter Details   Primary purpose of visit Acute Illness/Condition Visit;Spiritual Care/Support Visit;Education/Health Concerns   Was an Emergency Department visit averted? Not Applicable   Does patient have a medical provider? Yes   Patient referred to Clinic;Area Agency   Was a mental health screening completed? (GAINS tool) No   Does patient have dental issues? No   Does patient have vision issues? No   Does your patient have an abnormal blood pressure today? No   Since previous encounter, have you referred patient for abnormal blood pressure that resulted in a new diagnosis or medication change? No   Does your patient have an abnormal blood glucose today? No   Since previous encounter, have you referred patient for abnormal blood glucose that resulted in a new diagnosis or medication change? No   Was there a life-saving intervention made? No         Clinical Intake -  10/23/16 1038      Pre-visit preparation   Pre-visit preparation completed Yes     Pain   Pain  0-10   Pain Score 9    Pain Location Hand   Pain Orientation Right     Nutrition Screen   Diabetes No     Functional Status   Activities of Daily Living Independent   Ambulation Independent   Medication Administration Independent   Home Management Independent     Risk/Barriers   Barriers to Care Management & Learning None     Abuse/Neglect   Do you feel unsafe in your current relationship? No   Do you feel physically threatened by others? No   Anyone hurting you at home, work, or school? No   Unable to ask? No     Web designerLanguage Assistant   Interpreter Needed? No     Nurse made a referral to NP to see client  Regarding his sleeping problems ,getting up at night not  Remembering his actions  and disturbing other residents  ,he states he went to see NP today  But  was t too late ,plans to return tomorrow ,requesting bus tickets . Counseled on importance of  going to see NP regarding his sleeping problems and the problems it is causing other residents when he'" sleep walks ",he understands this cant keep occurring. . Not sure what needs to be done for client . Will follow up with client.

## 2016-10-30 ENCOUNTER — Ambulatory Visit: Payer: Medicaid Other

## 2016-11-09 ENCOUNTER — Ambulatory Visit: Payer: Self-pay | Attending: Family Medicine

## 2016-11-15 NOTE — Congregational Nurse Program (Signed)
Congregational Nurse Program Note  Date of Encounter: 11/15/2016  Past Medical History: Past Medical History:  Diagnosis Date  . Arthritis   . Sleep walking     Encounter Details:     CNP Questionnaire - 11/15/16 1909      Patient Demographics   Is this a new or existing patient? Existing   Patient is considered a/an Not Applicable   Race Native [place]     Patient Assistance   Location of Patient Assistance Not Applicable   Patient's financial/insurance status Self-Pay (Uninsured)   Uninsured Patient ([place] Card/Care Connects) Yes   Interventions Counseled to make appt. with provider   Patient referred to apply for the following financial assistance Not Applicable   Food insecurities addressed Not Applicable   Transportation assistance No   Assistance securing medications No   Educational health offerings Medications     Encounter Details   Primary purpose of visit Education/Health Concerns   Was an Emergency Department visit averted? Not Applicable   Does patient have a medical provider? Yes   Patient referred to Clinic;Follow up with established PCP   Was a mental health screening completed? (GAINS tool) No   Does patient have dental issues? No   Does patient have vision issues? No   Does your patient have an abnormal blood pressure today? No   Since previous encounter, have you referred patient for abnormal blood pressure that resulted in a new diagnosis or medication change? No   Does your patient have an abnormal blood glucose today? No   Since previous encounter, have you referred patient for abnormal blood glucose that resulted in a new diagnosis or medication change? No   Was there a life-saving intervention made? No     Client in asking for antifungal cream?? When ask what  for he states antifungal cream ?? [facility].Then stated I have to buy over the counter , nurse explained her role in securing  medications he then stated u cant buy .  Client also ask for band aids and was given several and left .

## 2016-11-16 MED FILL — ?PHENYTOIN SOD EXT 100 MG c: 100 | 30 days supply | Qty: 90 | Fill #1

## 2016-11-27 ENCOUNTER — Ambulatory Visit: Payer: Self-pay | Attending: Family Medicine | Admitting: Family Medicine

## 2016-11-27 ENCOUNTER — Encounter: Payer: Self-pay | Admitting: Family Medicine

## 2016-11-27 VITALS — BP 129/71 | HR 60 | Temp 98.3°F | Resp 18 | Ht 66.0 in | Wt 229.0 lb

## 2016-11-27 DIAGNOSIS — M175 Other unilateral secondary osteoarthritis of knee: Secondary | ICD-10-CM

## 2016-11-27 DIAGNOSIS — M1711 Unilateral primary osteoarthritis, right knee: Secondary | ICD-10-CM | POA: Insufficient documentation

## 2016-11-27 DIAGNOSIS — G4730 Sleep apnea, unspecified: Secondary | ICD-10-CM | POA: Insufficient documentation

## 2016-11-27 DIAGNOSIS — Z79899 Other long term (current) drug therapy: Secondary | ICD-10-CM | POA: Insufficient documentation

## 2016-11-27 DIAGNOSIS — R569 Unspecified convulsions: Secondary | ICD-10-CM | POA: Insufficient documentation

## 2016-11-27 DIAGNOSIS — G4750 Parasomnia, unspecified: Secondary | ICD-10-CM | POA: Insufficient documentation

## 2016-11-27 DIAGNOSIS — G4759 Other parasomnia: Secondary | ICD-10-CM

## 2016-11-27 MED ORDER — CLONIDINE HCL 0.1 MG PO TABS: 0.1000 mg | ORAL_TABLET | Freq: Every day | ORAL | 1 refills | Status: DC

## 2016-11-27 MED ORDER — DICLOFENAC SODIUM 1 % TD GEL: 4.0000 g | Freq: Four times a day (QID) | TRANSDERMAL | 1 refills | Status: DC

## 2016-11-27 MED FILL — ?CLONIDINE HCL 0.1 MG TABL: 0.1 | 30 days supply | Qty: 30 | Fill #0

## 2016-11-27 MED FILL — DICLOFENAC SODIUM 1% GEL: 1 | 6 days supply | Qty: 100 | Fill #0

## 2016-11-27 NOTE — Patient Instructions (Signed)
Joint Pain Joint pain, which is also called arthralgia, can be caused by many things. Joint pain often goes away when you follow your health care provider's instructions for relieving pain at home. However, joint pain can also be caused by conditions that require further treatment. Common causes of joint pain include:  Bruising in the area of the joint.  Overuse of the joint.  Wear and tear on the joints that occur with aging (osteoarthritis).  Various other forms of arthritis.  A buildup of a crystal form of uric acid in the joint (gout).  Infections of the joint (septic arthritis) or of the bone (osteomyelitis).  Your health care provider may recommend medicine to help with the pain. If your joint pain continues, additional tests may be needed to diagnose your condition. Follow these instructions at home: Watch your condition for any changes. Follow these instructions as directed to lessen the pain that you are feeling.  Take medicines only as directed by your health care provider.  Rest the affected area for as long as your health care provider says that you should. If directed to do so, raise the painful joint above the level of your heart while you are sitting or lying down.  Do not do things that cause or worsen pain.  If directed, apply ice to the painful area: ? Put ice in a plastic bag. ? Place a towel between your skin and the bag. ? Leave the ice on for 20 minutes, 2-3 times per day.  Wear an elastic bandage, splint, or sling as directed by your health care provider. Loosen the elastic bandage or splint if your fingers or toes become numb and tingle, or if they turn cold and blue.  Begin exercising or stretching the affected area as directed by your health care provider. Ask your health care provider what types of exercise are safe for you.  Keep all follow-up visits as directed by your health care provider. This is important.  Contact a health care provider if:  Your  pain increases, and medicine does not help.  Your joint pain does not improve within 3 days.  You have increased bruising or swelling.  You have a fever.  You lose 10 lb (4.5 kg) or more without trying. Get help right away if:  You are not able to move the joint.  Your fingers or toes become numb or they turn cold and blue. This information is not intended to replace advice given to you by your health care provider. Make sure you discuss any questions you have with your health care provider. Document Released: 04/17/2005 Document Revised: 09/17/2015 Document Reviewed: 01/27/2014 Elsevier Interactive Patient Education  2018 Elsevier Inc.  

## 2016-11-27 NOTE — Progress Notes (Signed)
Subjective:  Patient ID: Michael Shea, male    DOB: 28-Aug-1955  Age: 61 y.o. MRN: [id number]  CC: Medication Problem   HPI Michael Amenlberto Salsberry is a 61 year old male with a history of seizures, osteoarthritis who [facility] and presents today requesting evaluation for his Dilantin.  Review of his chart indicate a therapeutic Dilantin level of 10/09/16 He endorses compliance with Dilantin and states his last seizure was in 2004.  He requests an "arthritis cream" for his hand as he states meloxicam is not working.  Complains of sleep walking which he states occurs about once every month; he sometimes has nightmares. He previously had a history of sleep apnea and was prescribed a CPAP machine but he states his normal good and no longer needs it to sleep.  Past Medical History:  Diagnosis Date  . Arthritis   . Sleep walking     History reviewed. No pertinent surgical history.  No Known Allergies   Outpatient Medications Prior to Visit  Medication Sig Dispense Refill  . atorvastatin (LIPITOR) 20 MG tablet Take 1 tablet (20 mg total) by mouth daily. 30 tablet 3  . cetirizine (ZYRTEC) 10 MG tablet Take 1 tablet (10 mg total) by mouth daily. 30 tablet 1  . ergocalciferol (DRISDOL) 50000 units capsule Take 1 capsule (50,000 Units total) by mouth once a week. 9 capsule 1  . meclizine (ANTIVERT) 25 MG tablet Take 1 tablet (25 mg total) by mouth 3 (three) times daily as needed. 60 tablet 1  . Menthol-Methyl Salicylate (THERA-GESIC) 1-15 % CREA Apply 1 Dose topically 2 (two) times daily. 1 Tube 3  . phenytoin (DILANTIN) 100 MG ER capsule Take 3 capsules (300 mg total) by mouth at bedtime. 90 capsule 3  . meloxicam (MOBIC) 15 MG tablet Take 1 tablet (15 mg total) by mouth daily. 30 tablet 3  . predniSONE (DELTASONE) 20 MG tablet Take 1 tablet (20 mg total) by mouth 2 (two) times daily with a meal. 10 tablet 0   No facility-administered medications prior to visit.      ROS Review of Systems  Constitutional: Negative for activity change and appetite change.  HENT: Negative for sinus pressure and sore throat.   Eyes: Negative for visual disturbance.  Respiratory: Negative for cough, chest tightness and shortness of breath.   Cardiovascular: Negative for chest pain and leg swelling.  Gastrointestinal: Negative for abdominal distention, abdominal pain, constipation and diarrhea.  Endocrine: Negative.   Genitourinary: Negative for dysuria.  Musculoskeletal:       See hpi  Skin: Negative for rash.  Allergic/Immunologic: Negative.   Neurological: Negative for weakness, light-headedness and numbness.  Psychiatric/Behavioral: Negative for dysphoric mood and suicidal ideas.    Objective:  BP 129/71 (BP Location: Left Arm, Patient Position: Sitting, Cuff Size: Normal)   Pulse 60   Temp 98.3 F (36.8 C) (Oral)   Resp 18   Ht 5\' 6"  (1.676 m)   Wt 229 lb (103.9 kg)   SpO2 98%   BMI 36.96 kg/m   BP/Weight 11/27/2016 10/23/2016 10/09/2016  Systolic BP 129 154 126  Diastolic BP 71 69 76  Wt. (Lbs) 229 229 226  BMI 36.96 35.87 35.4      Physical Exam  Constitutional: He is oriented to person, place, and time. He appears well-developed and well-nourished.  Cardiovascular: Normal rate, normal heart sounds and intact distal pulses.   No murmur heard. Pulmonary/Chest: Effort normal and breath sounds normal. He has no wheezes. He  has no rales. He exhibits no tenderness.  Abdominal: Soft. Bowel sounds are normal. He exhibits no distension and no mass. There is no tenderness.  Musculoskeletal: Normal range of motion.  Neurological: He is alert and oriented to person, place, and time.     Assessment & Plan:   1. Other secondary osteoarthritis of right knee Discussed drug drug interaction between meloxicam and Voltaren gel - up to 10% risk of absorption with the former given the topical which could place him at risk for GI bleed I have discontinued  meloxicam as per patient request and prescribed Voltaren gel - diclofenac sodium (VOLTAREN) 1 % GEL; Apply 4 g topically 4 (four) times daily.  Dispense: 100 g; Refill: 1  2. Seizures (HCC) Stable Last Dilantin level was therapeutic Continue current dose of Dilantin.  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  Meds ordered this encounter  Medications  . diclofenac sodium (VOLTAREN) 1 % GEL    Sig: Apply 4 g topically 4 (four) times daily.    Dispense:  100 g    Refill:  1    Discontinue meloxicam    Follow-up: Return in about 3 months (around 02/27/2017) for Follow-up on chronic medical conditions.   Jaclyn ShaggyEnobong Amao MD

## 2016-12-11 MED FILL — VIT D2 1.25 MG (50,000 UNIT: 1.25 MG | 27 days supply | Qty: 4 | Fill #1

## 2016-12-15 ENCOUNTER — Ambulatory Visit: Payer: Self-pay | Attending: Family Medicine | Admitting: Family Medicine

## 2016-12-15 ENCOUNTER — Encounter: Payer: Self-pay | Admitting: Family Medicine

## 2016-12-15 VITALS — BP 130/69 | HR 62 | Temp 97.7°F | Ht 66.0 in | Wt 232.0 lb

## 2016-12-15 DIAGNOSIS — Z029 Encounter for administrative examinations, unspecified: Secondary | ICD-10-CM

## 2016-12-15 DIAGNOSIS — G4709 Other insomnia: Secondary | ICD-10-CM

## 2016-12-15 DIAGNOSIS — M1990 Unspecified osteoarthritis, unspecified site: Secondary | ICD-10-CM | POA: Insufficient documentation

## 2016-12-15 DIAGNOSIS — R569 Unspecified convulsions: Secondary | ICD-10-CM | POA: Insufficient documentation

## 2016-12-15 DIAGNOSIS — G4700 Insomnia, unspecified: Secondary | ICD-10-CM | POA: Insufficient documentation

## 2016-12-15 NOTE — Progress Notes (Signed)
Subjective:  Patient ID: Michael Shea, male    DOB: Oct 13, 1955  Age: 61 y.o. MRN: [id number]  CC: USCIS exam  HPI Michael Shea is a 61 year old male with a history of seizures, osteoarthritis who was previously followed at the [facility] Who presents today requesting a USCIS civil surgeon exam. I have reviewed the form he has with him and informed him that only a certified Primary school teacher is certified to perform the exam and complete the form but he informs me he is unable to afford the fees requested.  He was placed on clonidine at his last visit for parasomnia and reports having to take the medication every 2 days as 'it is too strong' but states it helps with his parasomnias.  His pH Q9 indicates a score of 22 however he denies being depressed at this time and he tells me sometimes he is. But he is refusing medications or intervention.  Past Medical History:  Diagnosis Date  . Arthritis   . Sleep walking     No past surgical history on file.  No Known Allergies   Outpatient Medications Prior to Visit  Medication Sig Dispense Refill  . atorvastatin (LIPITOR) 20 MG tablet Take 1 tablet (20 mg total) by mouth daily. 30 tablet 3  . cetirizine (ZYRTEC) 10 MG tablet Take 1 tablet (10 mg total) by mouth daily. 30 tablet 1  . cloNIDine (CATAPRES) 0.1 MG tablet Take 1 tablet (0.1 mg total) by mouth at bedtime. For parasomnia 30 tablet 1  . diclofenac sodium (VOLTAREN) 1 % GEL Apply 4 g topically 4 (four) times daily. 100 g 1  . ergocalciferol (DRISDOL) 50000 units capsule Take 1 capsule (50,000 Units total) by mouth once a week. 9 capsule 1  . meclizine (ANTIVERT) 25 MG tablet Take 1 tablet (25 mg total) by mouth 3 (three) times daily as needed. 60 tablet 1  . Menthol-Methyl Salicylate (THERA-GESIC) 1-15 % CREA Apply 1 Dose topically 2 (two) times daily. 1 Tube 3  . phenytoin (DILANTIN) 100 MG ER capsule Take 3 capsules (300 mg total) by mouth at bedtime. 90 capsule 3   No  facility-administered medications prior to visit.     ROS Review of Systems  Constitutional: Negative for activity change and appetite change.  HENT: Negative for sinus pressure and sore throat.   Eyes: Negative for visual disturbance.  Respiratory: Negative for cough, chest tightness and shortness of breath.   Cardiovascular: Negative for chest pain and leg swelling.  Gastrointestinal: Negative for abdominal distention, abdominal pain, constipation and diarrhea.  Endocrine: Negative.   Genitourinary: Negative for dysuria.  Musculoskeletal: Negative for joint swelling and myalgias.  Skin: Negative for rash.  Allergic/Immunologic: Negative.   Neurological: Negative for weakness, light-headedness and numbness.  Psychiatric/Behavioral: Negative for dysphoric mood and suicidal ideas.    Objective:  BP 130/69   Pulse 62   Temp 97.7 F (36.5 C) (Oral)   Ht 5\' 6"  (1.676 m)   Wt 232 lb (105.2 kg)   SpO2 97%   BMI 37.45 kg/m   BP/Weight 12/15/2016 11/27/2016 10/23/2016  Systolic BP 130 129 154  Diastolic BP 69 71 69  Wt. (Lbs) 232 229 229  BMI 37.45 36.96 35.87      Physical Exam  Constitutional: He is oriented to person, place, and time. He appears well-developed and well-nourished.  Cardiovascular: Normal rate, normal heart sounds and intact distal pulses.   No murmur heard. Pulmonary/Chest: Effort normal and breath sounds normal. He has no  wheezes. He has no rales. He exhibits no tenderness.  Abdominal: Soft. Bowel sounds are normal. He exhibits no distension and no mass. There is no tenderness.  Musculoskeletal: Normal range of motion.  Neurological: He is alert and oriented to person, place, and time.     Assessment & Plan:   1. Other insomnia Controlled on clonidine however he states this is too strong. He takes clonidine every 2 days and this helps with his parasomnias.  2. Administrative encounter Discussed that he will need to look online for addresses of USCIS  civil surgeon to have his immigration exams performed.   No orders of the defined types were placed in this encounter.   Follow-up: Return for Follow-up of chronic medical conditions, please keep previously scheduled appointment.   This note has been created with Education officer, environmental. Any transcriptional errors are unintentional.     Jaclyn Shaggy MD

## 2016-12-20 NOTE — Congregational Nurse Program (Signed)
Congregational Nurse Program Note  Date of Encounter: 12/20/2016  Past Medical History: Past Medical History:  Diagnosis Date  . Arthritis   . Sleep walking     Encounter Details:     CNP Questionnaire - 12/20/16 1832      Patient Demographics   Is this a new or existing patient? Existing   Patient is considered a/an Not Applicable   Race Latino/Hispanic     Patient Assistance   Location of Patient Assistance Not Applicable   Patient's financial/insurance status Self-Pay (Uninsured)   Uninsured Patient ([place] Card/Care Connects) Yes   Interventions Counseled to make appt. with provider   Patient referred to apply for the following financial assistance Not Applicable   Food insecurities addressed Not Applicable   Transportation assistance No   Assistance securing medications No   Educational health offerings Acute disease     Encounter Details   Primary purpose of visit Spiritual Care/Support Visit;Education/Health Concerns   Was an Emergency Department visit averted? Not Applicable   Does patient have a medical provider? Yes   Patient referred to Area Agency;Clinic   Was a mental health screening completed? (GAINS tool) No   Does patient have dental issues? No   Does patient have vision issues? No   Does your patient have an abnormal blood pressure today? No   Since previous encounter, have you referred patient for abnormal blood pressure that resulted in a new diagnosis or medication change? No   Does your patient have an abnormal blood glucose today? No   Since previous encounter, have you referred patient for abnormal blood glucose that resulted in a new diagnosis or medication change? No   Was there a life-saving intervention made? No         Clinical Intake - 12/15/16 0911      Pre-visit preparation   Pre-visit preparation completed Yes     Pain   Pain  0-10   Pain Score 6    Pain Location Finger (Comment which one)   Pain Orientation Left     Nutrition Screen   Diabetes No     Functional Status   Activities of Daily Living Independent   Ambulation Independent   Medication Administration Independent   Home Management Independent     Risk/Barriers   Barriers to Care Management & Learning None     Abuse/Neglect   Do you feel unsafe in your current relationship? No   Do you feel physically threatened by others? No   Anyone hurting you at home, work, or school? No   Unable to ask? No     Web designer Needed? No    Not  Sure what client wants ,but he states he has arthritis  In his fingers ,wants to know what to take for it ,encouraged him to get the advice of pharmacist  What he can take over the counter ,ask him also if pain is bad enough see the NP @ [facility] where he goes for primary care.  Ask about gloves to wear on his hand referred him to [facility] ??? Client ask for several band aids   To wrap his fingers .

## 2016-12-22 MED FILL — VOLTAREN 1% GEL: 1 | 6 days supply | Qty: 100 | Fill #1

## 2016-12-22 MED FILL — ?PHENYTOIN SOD EXT 100 MG c: 100 | 30 days supply | Qty: 90 | Fill #2

## 2017-01-22 NOTE — Congregational Nurse Program (Signed)
Congregational Nurse Program Note  Date of Encounter: 01/17/2017  Past Medical History: Past Medical History:  Diagnosis Date  . Arthritis   . Sleep walking     Encounter Details:     CNP Questionnaire - 01/22/17 1523      Patient Demographics   Is this a new or existing patient? Existing   Patient is considered a/an Not Applicable   Race Latino/Hispanic     Patient Assistance   Location of Patient Assistance Not Applicable   Patient's financial/insurance status Self-Pay (Uninsured)   Uninsured Patient ([place] Card/Care Connects) Yes   Interventions Counseled to make appt. with provider   Patient referred to apply for the following financial assistance Not Applicable   Food insecurities addressed Not Applicable   Transportation assistance No   Assistance securing medications No   Educational health offerings Safety;Navigating the healthcare system     Encounter Details   Primary purpose of visit Acute Illness/Condition Visit;Spiritual Care/Support Visit;Education/Health Concerns   Was an Emergency Department visit averted? Yes   Does patient have a medical provider? Yes   Patient referred to Clinic;Follow up with established PCP   Was a mental health screening completed? (GAINS tool) No   Does patient have dental issues? No   Does patient have vision issues? No   Does your patient have an abnormal blood pressure today? Yes   Since previous encounter, have you referred patient for abnormal blood pressure that resulted in a new diagnosis or medication change? No   Does your patient have an abnormal blood glucose today? No   Since previous encounter, have you referred patient for abnormal blood glucose that resulted in a new diagnosis or medication change? No   Was there a life-saving intervention made? No    client was seen in parking lot and had fallen while  Walking .states he fell on his face and continued to walk to [facility] ,no obvious breaks ,had abrasions on hand very  simple and had busted his lip ,able to talk ,no broken teeth ,stresssed  Due to fall , some swelling of lip,counseled to clean mouth with salt water and gargle ,client also wants to gargle with peroxide .encouraged not to drink any of it if he uses it ,encouraged the warm salt water. ,take a shower . Wanted band ids  To cover superficial abrasions ,encouraged to clean and leave open to air ,using OTC cream ,states I can tlay down ,ask why ,client will have some sore muscles ,encouraged to relax ,no need for emergency room viist ,see NP at  [facility] if needed ,client not hearing that . Not sure what he will do . Offered support and counseling.

## 2017-01-25 NOTE — Congregational Nurse Program (Signed)
Congregational Nurse Program Note  Date of Encounter: 01/23/2017  Past Medical History: Past Medical History:  Diagnosis Date  . Arthritis   . Sleep walking     Encounter Details:     CNP Questionnaire - 01/25/17 1711      Patient Demographics   Is this a new or existing patient? Existing   Patient is considered a/an Not Applicable   Race Latino/Hispanic     Patient Assistance   Location of Patient Assistance Not Applicable   Patient's financial/insurance status Self-Pay (Uninsured)   Uninsured Patient ([employer] Card/Care Connects) Yes   Interventions Follow-up/Education/Support provided after completed appt.   Patient referred to apply for the following financial assistance Not Applicable   Food insecurities addressed Not Applicable   Transportation assistance No   Assistance securing medications No   Educational health offerings Medications     Encounter Details   Primary purpose of visit Spiritual Care/Support Visit;Education/Health Concerns   Was an Emergency Department visit averted? Not Applicable   Does patient have a medical provider? Yes   Patient referred to Clinic;Follow up with established PCP   Was a mental health screening completed? (GAINS tool) No   Does patient have dental issues? No   Does patient have vision issues? No   Does your patient have an abnormal blood pressure today? No   Since previous encounter, have you referred patient for abnormal blood pressure that resulted in a new diagnosis or medication change? No   Does your patient have an abnormal blood glucose today? No   Since previous encounter, have you referred patient for abnormal blood glucose that resulted in a new diagnosis or medication change? No   Was there a life-saving intervention made? No    Client much better from fall last week ,states abrasions as okay now . Requested flu vaccine and it was given to him . Card for flu given.

## 2017-02-06 ENCOUNTER — Telehealth: Payer: Self-pay | Admitting: Family Medicine

## 2017-02-06 ENCOUNTER — Other Ambulatory Visit: Payer: Self-pay | Admitting: Family Medicine

## 2017-02-06 DIAGNOSIS — M175 Other unilateral secondary osteoarthritis of knee: Secondary | ICD-10-CM

## 2017-02-06 MED ORDER — DICLOFENAC SODIUM 1 % TD GEL: 4.0000 g | Freq: Four times a day (QID) | TRANSDERMAL | 0 refills | Status: DC

## 2017-02-06 MED FILL — cloNIDine HCL 0.1 MG TABS: 0.1 | 30 days supply | Qty: 30 | Fill #1

## 2017-02-06 MED FILL — VOLTAREN 1% GEL: 1 | 12 days supply | Qty: 100 | Fill #0

## 2017-02-06 MED FILL — ?PHENYTOIN SOD EXT 100 MG C: 100 | 30 days supply | Qty: 90 | Fill #3

## 2017-02-06 NOTE — Telephone Encounter (Signed)
Pt called to request a refill for  diclofenac sodium (VOLTAREN) 1 % GEL   Please follow up

## 2017-02-06 NOTE — Telephone Encounter (Signed)
Refilled

## 2017-02-08 NOTE — Congregational Nurse Program (Signed)
Congregational Nurse Program Note  Date of Encounter: 02/07/2017  Past Medical History: Past Medical History:  Diagnosis Date  . Arthritis   . Sleep walking     Encounter Details:     CNP Questionnaire - 02/08/17 2150      Patient Demographics   Is this a new or existing patient? New   Patient is considered a/an Not Applicable   Race Latino/Hispanic     Patient Assistance   Location of Patient Assistance Not Applicable   Patient's financial/insurance status [employer] Card/Care Connects   Uninsured Patient ([employer] Card/Care Connects) Yes   Interventions Follow-up/Education/Support provided after completed appt.   Food insecurities addressed Not Applicable   Transportation assistance No   Assistance securing medications Yes   Type of Assistance Other   Educational health offerings Medications     Encounter Details   Primary purpose of visit Education/Health Concerns;Spiritual Care/Support Visit;Navigating the Healthcare System   Was an Emergency Department visit averted? Not Applicable   Does patient have a medical provider? Yes   Patient referred to Follow up with established PCP   Was a mental health screening completed? (GAINS tool) No   Does patient have dental issues? No   Does patient have vision issues? No   Does your patient have an abnormal blood pressure today? No   Since previous encounter, have you referred patient for abnormal blood pressure that resulted in a new diagnosis or medication change? No   Does your patient have an abnormal blood glucose today? No   Since previous encounter, have you referred patient for abnormal blood glucose that resulted in a new diagnosis or medication change? No   Was there a life-saving intervention made? No     Client in stating he has sleep apnea and needs medication for this ,nurse counseled regarding what he needs to do ,states he cant go to [facility] but  Goes to [facility] now  And they called to refill his medications and  Needs  a  way to go get them. Nurse gave client two bus tickets and explained to him what bus tickets are to be used for.Not sure where client is going will review his record and talk with [facility] providers.Wanted nose drops ,again counseled client on roles of providers ,suggested saline  Nose drops or check with pharmacy for suggestions. Client then went on to talk about his job possibilities at [employer] . And his training needs. Client continued to ramble on from one subject to another.

## 2017-02-13 ENCOUNTER — Other Ambulatory Visit: Payer: Self-pay | Admitting: Family Medicine

## 2017-02-21 ENCOUNTER — Telehealth: Payer: Self-pay

## 2017-02-21 NOTE — Congregational Nurse Program (Signed)
Congregational Nurse Program Note  Date of Encounter: 02/20/2017  Past Medical History: Past Medical History:  Diagnosis Date  . Arthritis   . Sleep walking     Encounter Details:     CNP Questionnaire - 02/21/17 0126      Patient Demographics   Is this a new or existing patient? Existing     Patient Assistance   Location of Patient Assistance Not Applicable   Patient's financial/insurance status [employer] Research officer, trade unionCard/Care Connects   Uninsured Patient ([place] Card/Care Connects) Yes   Interventions Follow-up/Education/Support provided after completed appt.   Patient referred to apply for the following financial assistance Not Applicable   Food insecurities addressed Not Applicable   Transportation assistance No   Type of Assistance Other   Educational health offerings Navigating the healthcare system;Medications     Encounter Details   Primary purpose of visit Navigating the Healthcare System;Education/Health Concerns   Was an Emergency Department visit averted? Not Applicable   Does patient have a medical provider? Yes   Patient referred to Clinic;Follow up with established PCP   Was a mental health screening completed? (GAINS tool) No   Does patient have dental issues? No   Does patient have vision issues? No   Does your patient have an abnormal blood pressure today? No   Since previous encounter, have you referred patient for abnormal blood pressure that resulted in a new diagnosis or medication change? No   Does your patient have an abnormal blood glucose today? No   Since previous encounter, have you referred patient for abnormal blood glucose that resulted in a new diagnosis or medication change? No   Was there a life-saving intervention made? No     Client in requesting bus tickets to pick up medication from pharmacy but was given bus passes a week ago . Nurse counseled client and will stop by to pick up his medications tomorrow ,no bus passes given today . States his  medications were not ready last week .  Alert client after medications are picked up.

## 2017-02-21 NOTE — Telephone Encounter (Signed)
Called for refills for client and nurse would  Pick up today . Told no refills available ,ask they check the list twice ,no refills . Will alert client this pm.

## 2017-02-21 NOTE — Congregational Nurse Program (Signed)
Congregational Nurse Program Note  Date of Encounter: 02/21/2017  Past Medical History: Past Medical History:  Diagnosis Date  . Arthritis   . Sleep walking     Encounter Details:     CNP Questionnaire - 02/21/17 1835      Patient Demographics   Is this a new or existing patient? Existing   Patient is considered a/an Not Applicable   Race Latino/Hispanic     Patient Assistance   Location of Patient Assistance Not Applicable   Patient's financial/insurance status [employer] Research officer, trade unionCard/Care Connects   Uninsured Patient ([place] Card/Care Connects) Yes   Interventions Follow-up/Education/Support provided after completed appt.   Patient referred to apply for the following financial assistance Not Applicable   Food insecurities addressed Not Applicable   Transportation assistance Yes   Type of Assistance --  bus passes   Assistance securing medications Yes   Type of Assistance Other   Educational health offerings Navigating the healthcare system     Encounter Details   Primary purpose of visit Spiritual Care/Support Visit;Navigating the Healthcare System   Was an Emergency Department visit averted? Not Applicable   Does patient have a medical provider? Yes   Patient referred to Follow up with established PCP;Clinic   Was a mental health screening completed? (GAINS tool) No   Does patient have dental issues? No   Does patient have vision issues? No   Does your patient have an abnormal blood pressure today? No   Since previous encounter, have you referred patient for abnormal blood pressure that resulted in a new diagnosis or medication change? No   Does your patient have an abnormal blood glucose today? No   Since previous encounter, have you referred patient for abnormal blood glucose that resulted in a new diagnosis or medication change? No   Was there a life-saving intervention made? No     Nurse called  [facility] to check on refills  Per clients request and was told no refills at this  time. Client in the nurses office stating he does have a refills . [facility] with [facility] assistance again with client present and was told no refills  ,client insisted he had  Vitamin D refill, pharmacy then states we can fill his  Vitamin d on friday ! Client was then relieved and stated I knew I had a  refill  And was okay . Client advocated for what he felt he was due. Bus tickets given for client to pick up on Friday Support as needed

## 2017-02-27 ENCOUNTER — Encounter (HOSPITAL_COMMUNITY): Payer: Self-pay

## 2017-02-27 ENCOUNTER — Emergency Department (HOSPITAL_COMMUNITY)
Admission: EM | Admit: 2017-02-27 | Discharge: 2017-02-27 | Disposition: A | Discharge status: Home / Self Care | Payer: Self-pay | Attending: Emergency Medicine | Admitting: Emergency Medicine

## 2017-02-27 DIAGNOSIS — Z79899 Other long term (current) drug therapy: Secondary | ICD-10-CM | POA: Insufficient documentation

## 2017-02-27 DIAGNOSIS — Z760 Encounter for issue of repeat prescription: Secondary | ICD-10-CM

## 2017-02-27 DIAGNOSIS — G40909 Epilepsy, unspecified, not intractable, without status epilepticus: Secondary | ICD-10-CM | POA: Insufficient documentation

## 2017-02-27 MED ORDER — PHENYTOIN SODIUM EXTENDED 100 MG PO CAPS: 300.0000 mg | ORAL_CAPSULE | Freq: Every day | ORAL | 0 refills | Status: DC

## 2017-02-27 MED ORDER — PHENYTOIN SODIUM EXTENDED 100 MG PO CAPS
300.0000 mg | ORAL_CAPSULE | Freq: Once | ORAL | Status: AC
  Administered 2017-02-27: 300 mg via ORAL
  Filled 2017-02-27: qty 3

## 2017-02-27 NOTE — ED Notes (Signed)
Patient is A&Ox4.  No signs of distress noted.  Please see providers complete history and physical exam.  

## 2017-02-27 NOTE — ED Triage Notes (Signed)
Pt here for dilantin prescription refill.

## 2017-02-27 NOTE — ED Provider Notes (Signed)
[facility] EMERGENCY DEPARTMENT Provider Note   CSN: [id number] Arrival date & time: 02/27/17  2216     History   Chief Complaint Chief Complaint  Patient presents with  . Medication Refill    HPI Michael Shea is a 61 y.o. male.  HPI  61 y.o. male, presents to the Emergency Department today for medication refill. Pt takes Dilantin for seizures. Pt last dose last night. No follow up with Neurology and requesting assistance. Pt moved from out of town. Denies medical complaints at this time.    Past Medical History:  Diagnosis Date  . Arthritis   . Sleep walking     Patient Active Problem List   Diagnosis Date Noted  . Osteoarthritis 10/09/2016  . Seizures (HCC) 12/16/2015  . Chronic left hip pain 12/16/2015    History reviewed. No pertinent surgical history.     Home Medications    Prior to Admission medications   Medication Sig Start Date End Date Taking? Authorizing Provider  atorvastatin (LIPITOR) 20 MG tablet Take 1 tablet (20 mg total) by mouth daily. 10/10/16   Jaclyn Shaggy, MD  cetirizine (ZYRTEC) 10 MG tablet Take 1 tablet (10 mg total) by mouth daily. 10/11/16   Jaclyn Shaggy, MD  cloNIDine (CATAPRES) 0.1 MG tablet Take 1 tablet (0.1 mg total) by mouth at bedtime. For parasomnia 11/27/16   Jaclyn Shaggy, MD  diclofenac sodium (VOLTAREN) 1 % GEL Apply 4 g topically 4 (four) times daily. 02/06/17   Jaclyn Shaggy, MD  ergocalciferol (DRISDOL) 50000 units capsule Take 1 capsule (50,000 Units total) by mouth once a week. 10/10/16   Jaclyn Shaggy, MD  meclizine (ANTIVERT) 25 MG tablet Take 1 tablet (25 mg total) by mouth 3 (three) times daily as needed. 10/23/16   Jaclyn Shaggy, MD  Menthol-Methyl Salicylate (THERA-GESIC) 1-15 % CREA Apply 1 Dose topically 2 (two) times daily. 12/03/15   Anders Simmonds, PA-C  phenytoin (DILANTIN) 100 MG ER capsule Take 3 capsules (300 mg total) by mouth at bedtime. 10/09/16   Jaclyn Shaggy, MD    Family  History Family History  Problem Relation Age of Onset  . Heart failure Mother   . Diabetes Father     Social History Social History  Substance Use Topics  . Smoking status: Never Smoker  . Smokeless tobacco: Never Used  . Alcohol use No     Allergies   Patient has no known allergies.   Review of Systems Review of Systems ROS reviewed and all are negative for acute change except as noted in the HPI.  Physical Exam Updated Vital Signs BP (P) 137/82   Pulse (P) 73   Temp (P) 98.4 F (36.9 C) (Oral)   Resp (P) 18   SpO2 (P) 97%   Physical Exam  Constitutional: He is oriented to person, place, and time. Vital signs are normal. He appears well-developed and well-nourished.  HENT:  Head: Normocephalic.  Right Ear: Hearing normal.  Left Ear: Hearing normal.  Eyes: Pupils are equal, round, and reactive to light. Conjunctivae and EOM are normal.  Cardiovascular: Normal rate and regular rhythm.   Pulmonary/Chest: Effort normal.  Neurological: He is alert and oriented to person, place, and time.  Skin: Skin is warm and dry.  Psychiatric: He has a normal mood and affect. His speech is normal and behavior is normal. Thought content normal.     ED Treatments / Results  Labs (all labs ordered are listed, but only abnormal results are displayed)  Labs Reviewed - No data to display  EKG  EKG Interpretation None       Radiology No results found.  Procedures Procedures (including critical care time)  Medications Ordered in ED Medications - No data to display   Initial Impression / Assessment and Plan / ED Course  I have reviewed the triage vital signs and the nursing notes.  Pertinent labs & imaging results that were available during my care of the patient were reviewed by me and considered in my medical decision making (see chart for details).  Final Clinical Impressions(s) / ED Diagnoses     {I have reviewed the relevant previous healthcare records.  {I  obtained HPI from historian.   ED Course:  Assessment: presents to the Emergency Department today for medication refill. Pt takes Dilantin for seizures. Pt last dose last night. No follow up with Neurology and requesting assistance. Pt moved from out of town. Denies medical complaints at this time.  Disposition/Plan:  DC HOme Additional Verbal discharge instructions given and discussed with patient.  Pt Instructed to f/u with neuro in the next week for evaluation and treatment of symptoms. Return precautions given Pt acknowledges and agrees with plan  Supervising Physician Cardama, Amadeo GarnetPedro Eduardo, *  Final diagnoses:  Medication refill    New Prescriptions New Prescriptions   No medications on file     Audry PiliMohr, Tashonna Descoteaux, Cordelia Poche-C 02/27/17 2259    Nira Connardama, Pedro Eduardo, MD 02/27/17 2351

## 2017-02-28 ENCOUNTER — Other Ambulatory Visit: Payer: Self-pay | Admitting: Family Medicine

## 2017-02-28 DIAGNOSIS — Z79899 Other long term (current) drug therapy: Secondary | ICD-10-CM | POA: Insufficient documentation

## 2017-02-28 DIAGNOSIS — G4759 Other parasomnia: Secondary | ICD-10-CM

## 2017-02-28 DIAGNOSIS — F5101 Primary insomnia: Secondary | ICD-10-CM | POA: Insufficient documentation

## 2017-02-28 MED FILL — VIT D2 1.25 MG (50,000 UNIT: 1.25 MG | 28 days supply | Qty: 4 | Fill #2

## 2017-03-01 ENCOUNTER — Encounter (HOSPITAL_COMMUNITY): Payer: Self-pay | Admitting: Emergency Medicine

## 2017-03-01 ENCOUNTER — Emergency Department (HOSPITAL_COMMUNITY)
Admission: EM | Admit: 2017-03-01 | Discharge: 2017-03-01 | Disposition: A | Discharge status: Home / Self Care | Payer: Self-pay | Attending: Emergency Medicine | Admitting: Emergency Medicine

## 2017-03-01 DIAGNOSIS — F5101 Primary insomnia: Secondary | ICD-10-CM

## 2017-03-01 LAB — CBC
HCT: 42.2 % (ref 39.0–52.0)
HEMOGLOBIN: 14.7 g/dL (ref 13.0–17.0)
MCH: 32 pg (ref 26.0–34.0)
MCHC: 34.8 g/dL (ref 30.0–36.0)
MCV: 91.9 fL (ref 78.0–100.0)
Platelets: 218 10*3/uL (ref 150–400)
RBC: 4.59 MIL/uL (ref 4.22–5.81)
RDW: 14.1 % (ref 11.5–15.5)
WBC: 10.9 10*3/uL — ABNORMAL HIGH (ref 4.0–10.5)

## 2017-03-01 LAB — ETHANOL

## 2017-03-01 LAB — COMPREHENSIVE METABOLIC PANEL
ALBUMIN: 3.8 g/dL (ref 3.5–5.0)
ALT: 19 U/L (ref 17–63)
ANION GAP: 10 (ref 5–15)
AST: 26 U/L (ref 15–41)
Alkaline Phosphatase: 113 U/L (ref 38–126)
BILIRUBIN TOTAL: 0.6 mg/dL (ref 0.3–1.2)
BUN: 13 mg/dL (ref 6–20)
CO2: 22 mmol/L (ref 22–32)
Calcium: 8.7 mg/dL — ABNORMAL LOW (ref 8.9–10.3)
Chloride: 105 mmol/L (ref 101–111)
Creatinine, Ser: 0.73 mg/dL (ref 0.61–1.24)
GFR calc Af Amer: >60 mL/min (ref >60)
Glucose, Bld: 139 mg/dL — ABNORMAL HIGH (ref 65–99)
POTASSIUM: 3.6 mmol/L (ref 3.5–5.1)
Sodium: 137 mmol/L (ref 135–145)
TOTAL PROTEIN: 6.6 g/dL (ref 6.5–8.1)

## 2017-03-01 LAB — RAPID URINE DRUG SCREEN, HOSP PERFORMED
AMPHETAMINES: NOT DETECTED
Barbiturates: NOT DETECTED
Benzodiazepines: NOT DETECTED
COCAINE: NOT DETECTED
OPIATES: NOT DETECTED
TETRAHYDROCANNABINOL: NOT DETECTED

## 2017-03-01 NOTE — ED Triage Notes (Signed)
Pt reports insomnia today, reports years of insomnia and psych diagnosis but tonight can't sleep os he came to ED. DENIES SI/HI.

## 2017-03-01 NOTE — ED Provider Notes (Signed)
[facility] Provider Note   CSN: [id number] Arrival date & time: 02/28/17  2337     History   Chief Complaint Chief Complaint  Patient presents with  . Insomnia    HPI Michael Shea is a 61 y.o. male.  The history is provided by the patient.  Insomnia  This is a chronic problem. The problem occurs daily. The problem has been gradually worsening. Pertinent negatives include no chest pain and no abdominal pain. Nothing aggravates the symptoms. Nothing relieves the symptoms.   Pt reports he is here for insomnia He reports this is an ongoing issue He reports over past several days he has been unable to sleep No other acute complaints except for chronic arthritis He denies excessive caffeine use His PCP has prescribed clonidine for insomnia He is requesting referral to sleep specialist  Past Medical History:  Diagnosis Date  . Arthritis   . Sleep walking     Patient Active Problem List   Diagnosis Date Noted  . Osteoarthritis 10/09/2016  . Seizures (HCC) 12/16/2015  . Chronic left hip pain 12/16/2015    History reviewed. No pertinent surgical history.     Home Medications    Prior to Admission medications   Medication Sig Start Date End Date Taking? Authorizing Provider  atorvastatin (LIPITOR) 20 MG tablet Take 1 tablet (20 mg total) by mouth daily. 10/10/16   Jaclyn ShaggyAmao, Enobong, MD  cetirizine (ZYRTEC) 10 MG tablet Take 1 tablet (10 mg total) by mouth daily. 10/11/16   Jaclyn ShaggyAmao, Enobong, MD  cloNIDine (CATAPRES) 0.1 MG tablet TAKE 1 TABLET BY MOUTH AT BEDTIME FOR PARASOMNIA 02/28/17   Quentin AngstJegede, Olugbemiga E, MD  diclofenac sodium (VOLTAREN) 1 % GEL Apply 4 g topically 4 (four) times daily. 02/06/17   Jaclyn ShaggyAmao, Enobong, MD  ergocalciferol (DRISDOL) 50000 units capsule Take 1 capsule (50,000 Units total) by mouth once a week. 10/10/16   Jaclyn ShaggyAmao, Enobong, MD  meclizine (ANTIVERT) 25 MG tablet Take 1 tablet (25 mg total) by mouth 3 (three) times  daily as needed. 10/23/16   Jaclyn ShaggyAmao, Enobong, MD  Menthol-Methyl Salicylate (THERA-GESIC) 1-15 % CREA Apply 1 Dose topically 2 (two) times daily. 12/03/15   Anders SimmondsMcClung, Angela M, PA-C  phenytoin (DILANTIN) 100 MG ER capsule Take 3 capsules (300 mg total) by mouth at bedtime. 02/27/17 03/29/17  Audry PiliMohr, Tyler, PA-C    Family History Family History  Problem Relation Age of Onset  . Heart failure Mother   . Diabetes Father     Social History Social History  Substance Use Topics  . Smoking status: Never Smoker  . Smokeless tobacco: Never Used  . Alcohol use No     Allergies   Patient has no known allergies.   Review of Systems Review of Systems  Constitutional: Negative for fever.  Cardiovascular: Negative for chest pain.  Gastrointestinal: Negative for abdominal pain.  Musculoskeletal: Positive for arthralgias.  Psychiatric/Behavioral: The patient has insomnia.      Physical Exam Updated Vital Signs BP (!) 141/87   Pulse 88   Temp 98.8 F (37.1 C)   Resp 18   Ht 1.753 m (5\' 9" )   Wt 95.3 kg (210 lb)   SpO2 100%   BMI 31.01 kg/m   Physical Exam CONSTITUTIONAL: pt sleeping on arrival to room HEAD: Normocephalic/atraumatic EYES: EOMI/PERRL ENMT: Mucous membranes moist NECK: supple no meningeal signs CV: S1/S2 noted, no murmurs/rubs/gallops noted LUNGS: Lungs are clear to auscultation bilaterally, no apparent distress ABDOMEN: soft, nontender  GU:no  cva tenderness NEURO: Pt is sleeping but easily arousable,  awake/alert/appropriate, moves all extremitiesx4.  No facial droop.  He is ambulatory SKIN: warm, color normal PSYCH: no abnormalities of mood noted, alert and oriented to situation   ED Treatments / Results  Labs (all labs ordered are listed, but only abnormal results are displayed) Labs Reviewed  COMPREHENSIVE METABOLIC PANEL - Abnormal; Notable for the following:       Result Value   Glucose, Bld 139 (*)    Calcium 8.7 (*)    All other components within  normal limits  CBC - Abnormal; Notable for the following:    WBC 10.9 (*)    All other components within normal limits  ETHANOL  RAPID URINE DRUG SCREEN, HOSP PERFORMED    EKG  EKG Interpretation None       Radiology No results found.  Procedures Procedures (including critical care time)  Medications Ordered in ED Medications - No data to display   Initial Impression / Assessment and Plan / ED Course  I have reviewed the triage vital signs and the nursing notes.  Pertinent labs results that were available during my care of the patient were reviewed by me and considered in my medical decision making (see chart for details).     Pt stable He was sleeping on my arrival to room Labs reassuring Referred to sleep specialist   Final Clinical Impressions(s) / ED Diagnoses   Final diagnoses:  Primary insomnia    New Prescriptions Discharge Medication List as of 03/01/2017  6:53 AM       Zadie Rhine, MD 03/01/17 0725

## 2017-03-01 NOTE — ED Notes (Signed)
Pt reports he is having problems sleeping. Pt states his PCP gave him clonidine to help him sleep but its not helping him.

## 2017-03-02 ENCOUNTER — Telehealth: Payer: Self-pay | Admitting: Family Medicine

## 2017-03-02 NOTE — Telephone Encounter (Signed)
Pt will be called and instructed to call office on Monday to be put on the walk-in schedule.

## 2017-03-02 NOTE — Telephone Encounter (Signed)
Pt called since he went to the ED on 03/01/17 and was refer to a sleep study center the phone number is [phone] Sleep center, is possible to have this referral ASAP,please follow up, [facility]

## 2017-03-08 ENCOUNTER — Other Ambulatory Visit: Payer: Self-pay | Admitting: Family Medicine

## 2017-03-08 DIAGNOSIS — R569 Unspecified convulsions: Secondary | ICD-10-CM

## 2017-03-09 ENCOUNTER — Encounter (HOSPITAL_COMMUNITY): Payer: Self-pay

## 2017-03-09 ENCOUNTER — Emergency Department (HOSPITAL_COMMUNITY)
Admission: EM | Admit: 2017-03-09 | Discharge: 2017-03-09 | Disposition: A | Discharge status: Home / Self Care | Payer: Self-pay | Attending: Emergency Medicine | Admitting: Emergency Medicine

## 2017-03-09 DIAGNOSIS — Z8669 Personal history of other diseases of the nervous system and sense organs: Secondary | ICD-10-CM | POA: Insufficient documentation

## 2017-03-09 DIAGNOSIS — Z79899 Other long term (current) drug therapy: Secondary | ICD-10-CM | POA: Insufficient documentation

## 2017-03-09 DIAGNOSIS — Z8659 Personal history of other mental and behavioral disorders: Secondary | ICD-10-CM | POA: Insufficient documentation

## 2017-03-09 DIAGNOSIS — F513 Sleepwalking [somnambulism]: Secondary | ICD-10-CM | POA: Insufficient documentation

## 2017-03-09 HISTORY — DX: Unspecified convulsions: R56.9

## 2017-03-09 LAB — CBC WITH DIFFERENTIAL/PLATELET
BASOS ABS: 0 10*3/uL (ref 0.0–0.1)
BASOS PCT: 0 %
Eosinophils Absolute: 0.1 10*3/uL (ref 0.0–0.7)
Eosinophils Relative: 1 %
HEMATOCRIT: 43.9 % (ref 39.0–52.0)
HEMOGLOBIN: 14.7 g/dL (ref 13.0–17.0)
Lymphocytes Relative: 19 %
Lymphs Abs: 1.7 10*3/uL (ref 0.7–4.0)
MCH: 30.9 pg (ref 26.0–34.0)
MCHC: 33.5 g/dL (ref 30.0–36.0)
MCV: 92.2 fL (ref 78.0–100.0)
Monocytes Absolute: 1.1 10*3/uL — ABNORMAL HIGH (ref 0.1–1.0)
Monocytes Relative: 12 %
NEUTROS ABS: 6 10*3/uL (ref 1.7–7.7)
NEUTROS PCT: 68 %
Platelets: 227 10*3/uL (ref 150–400)
RBC: 4.76 MIL/uL (ref 4.22–5.81)
RDW: 13.9 % (ref 11.5–15.5)
WBC: 8.9 10*3/uL (ref 4.0–10.5)

## 2017-03-09 LAB — URINALYSIS, ROUTINE W REFLEX MICROSCOPIC
Bilirubin Urine: NEGATIVE
Glucose, UA: NEGATIVE mg/dL
Hgb urine dipstick: NEGATIVE
Ketones, ur: NEGATIVE mg/dL
Leukocytes, UA: NEGATIVE
Nitrite: NEGATIVE
Protein, ur: NEGATIVE mg/dL
Specific Gravity, Urine: 1.014 (ref 1.005–1.030)
pH: 6 (ref 5.0–8.0)

## 2017-03-09 LAB — BASIC METABOLIC PANEL
ANION GAP: 7 (ref 5–15)
BUN: 16 mg/dL (ref 6–20)
CHLORIDE: 106 mmol/L (ref 101–111)
CO2: 26 mmol/L (ref 22–32)
Calcium: 8.7 mg/dL — ABNORMAL LOW (ref 8.9–10.3)
Creatinine, Ser: 0.75 mg/dL (ref 0.61–1.24)
GFR calc non Af Amer: >60 mL/min (ref >60)
Glucose, Bld: 131 mg/dL — ABNORMAL HIGH (ref 65–99)
POTASSIUM: 4.2 mmol/L (ref 3.5–5.1)
Sodium: 139 mmol/L (ref 135–145)

## 2017-03-09 LAB — PHENYTOIN LEVEL, TOTAL: Phenytoin Lvl: 16.4 ug/mL (ref 10.0–20.0)

## 2017-03-09 NOTE — ED Triage Notes (Signed)
Pt arrived via [facility], pt had possible seizure due to hx. [facility] states pt was at the [employer] trying on other peoples clothes and acting different. Pt has no recollation of the event. Continues to ask what happened last night. Hx of sleep walking in chart. Denies SOB or pain. A&O x4.  158/100, pulse 74 16 92% RA CBG 126

## 2017-03-09 NOTE — ED Notes (Signed)
Bed: [id number] Expected date:  Expected time:  Means of arrival:  Comments: 61 yo M  Possible seizure

## 2017-03-09 NOTE — Discharge Instructions (Signed)
Your lab tests today looked good.  Dilantin level was normal.  Stay on your usual medications.  We think you might have been sleep walking this evening. Follow-up with the sleep center to have your study done. Return here for any new/worsening symptoms.

## 2017-03-09 NOTE — ED Provider Notes (Signed)
[facility]-EMERGENCY DEPT Provider Note   CSN: [id number] Arrival date & time: 03/09/17  0227     History   Chief Complaint Chief Complaint  Patient presents with  . Seizures    HPI Saahir Prude is a 61 y.o. male.  The history is provided by the patient and medical records.     61 year old male with history of arthritis, seizure disorder, insomnia, sleep walking, presenting to the ED after questionable seizure.  Patient states he currently resides at [facility].  States the last thing he remembers last night was getting up to go to the bathroom and going back to sleep.  Bystanders report he was at [facility] trying on different close and was acting somewhat odd.  No witnessed seizure activity.  No head injury, bowel or bladder incontinence.  Patient states he does not remember being there or anything that happened there.  He has not had any recent changes to his medicines.  He has been compliant with his seizure medicine.  Denies any recent illness, fever, or chills.  States he is supposed to follow-up with sleep clinic to have a formal sleep study performed soon.  Past Medical History:  Diagnosis Date  . Arthritis   . Seizures (HCC)   . Sleep walking     Patient Active Problem List   Diagnosis Date Noted  . Osteoarthritis 10/09/2016  . Seizures (HCC) 12/16/2015  . Chronic left hip pain 12/16/2015    History reviewed. No pertinent surgical history.     Home Medications    Prior to Admission medications   Medication Sig Start Date End Date Taking? Authorizing Provider  atorvastatin (LIPITOR) 20 MG tablet Take 1 tablet (20 mg total) by mouth daily. 10/10/16   Jaclyn Shaggy, MD  cetirizine (ZYRTEC) 10 MG tablet Take 1 tablet (10 mg total) by mouth daily. 10/11/16   Jaclyn Shaggy, MD  cloNIDine (CATAPRES) 0.1 MG tablet TAKE 1 TABLET BY MOUTH AT BEDTIME FOR PARASOMNIA 02/28/17   Quentin Angst, MD  diclofenac sodium (VOLTAREN) 1 % GEL  Apply 4 g topically 4 (four) times daily. 02/06/17   Jaclyn Shaggy, MD  ergocalciferol (DRISDOL) 50000 units capsule Take 1 capsule (50,000 Units total) by mouth once a week. 10/10/16   Jaclyn Shaggy, MD  meclizine (ANTIVERT) 25 MG tablet Take 1 tablet (25 mg total) by mouth 3 (three) times daily as needed. 10/23/16   Jaclyn Shaggy, MD  Menthol-Methyl Salicylate (THERA-GESIC) 1-15 % CREA Apply 1 Dose topically 2 (two) times daily. 12/03/15   Anders Simmonds, PA-C  phenytoin (DILANTIN) 100 MG ER capsule Take 3 capsules (300 mg total) by mouth at bedtime. 02/27/17 03/29/17  Audry Pili, PA-C    Family History Family History  Problem Relation Age of Onset  . Heart failure Mother   . Diabetes Father     Social History Social History   Tobacco Use  . Smoking status: Never Smoker  . Smokeless tobacco: Never Used  Substance Use Topics  . Alcohol use: No  . Drug use: No     Allergies   Patient has no known allergies.   Review of Systems Review of Systems  Neurological: Positive for seizures (possible).  All other systems reviewed and are negative.    Physical Exam Updated Vital Signs BP 140/80 (BP Location: Left Arm)   Pulse 62   Temp 97.8 F (36.6 C) (Oral)   Resp 16   Ht 5\' 9"  (1.753 m)   Wt 95.3 kg (  210 lb)   SpO2 94%   BMI 31.01 kg/m   Physical Exam  Constitutional: He is oriented to person, place, and time. He appears well-developed and well-nourished.  HENT:  Head: Normocephalic and atraumatic.  Mouth/Throat: Oropharynx is clear and moist.  No signs of head truama No tongue or dental injuries noted  Eyes: Conjunctivae and EOM are normal. Pupils are equal, round, and reactive to light.  Neck: Normal range of motion.  Cardiovascular: Normal rate, regular rhythm and normal heart sounds.  Pulmonary/Chest: Effort normal and breath sounds normal. No stridor. No respiratory distress.  Abdominal: Soft. Bowel sounds are normal. There is no tenderness. There is no  rebound.  Musculoskeletal: Normal range of motion.  Neurological: He is alert and oriented to person, place, and time. He has normal strength. He is not disoriented. No cranial nerve deficit or sensory deficit.  AAOx3, answering questions and following commands appropriately; equal strength UE and LE bilaterally; CN grossly intact; moves all extremities appropriately without ataxia; no focal neuro deficits or facial asymmetry appreciated  Skin: Skin is warm and dry.  Psychiatric: He has a normal mood and affect.  Nursing note and vitals reviewed.    ED Treatments / Results  Labs (all labs ordered are listed, but only abnormal results are displayed) Labs Reviewed  CBC WITH DIFFERENTIAL/PLATELET - Abnormal; Notable for the following components:      Result Value   Monocytes Absolute 1.1 (*)    All other components within normal limits  BASIC METABOLIC PANEL - Abnormal; Notable for the following components:   Glucose, Bld 131 (*)    Calcium 8.7 (*)    All other components within normal limits  PHENYTOIN LEVEL, TOTAL  URINALYSIS, ROUTINE W REFLEX MICROSCOPIC    EKG  EKG Interpretation  Date/Time:  Friday March 09 2017 03:20:56 EST Ventricular Rate:  60 PR Interval:    QRS Duration: 97 QT Interval:  416 QTC Calculation: 416 R Axis:   -11 Text Interpretation:  Sinus rhythm Artifact in lead(s) I II III aVL aVF V1 V2 V3 V4 V5 V6 Otherwise within normal limits No old tracing to compare Confirmed by Dione BoozeGlick, David ([id number]) on 03/09/2017 3:23:39 AM       Radiology No results found.  Procedures Procedures (including critical care time)  Medications Ordered in ED Medications - No data to display   Initial Impression / Assessment and Plan / ED Course  I have reviewed the triage vital signs and the nursing notes.  Pertinent labs & imaging results that were available during my care of the patient were reviewed by me and considered in my medical decision making (see chart for  details).  61 year old male here with questionable seizure.  He was walking around the [employer] trying on clothes and seemed to be "acting odd" as reported by bystanders.  There was no witnessed seizure activity.  The patient cannot recall what happened, states last he remembers he was going to the bathroom and tried to go back to bed.  He does have a history of seizures as well as sleepwalking.  Reports he has been compliant with his seizure medications.  He has no physical complaints at this time.  Will obtain screening labs including Dilantin level.  Patient screening lab work overall reassuring.  Dilantin level is therapeutic.  Patient has been resting here without issue.  VSS. He has a long-standing history of insomnia, states he is due for a sleep study soon.  Suspect episode tonight more likely  due to sleep walking, feel seizure less likely.  Will have him follow-up with PCP as well as with the sleep center.  Discussed plan with patient, he acknowledged understanding and agreed with plan of care.  Return precautions given for new or worsening symptoms.  Final Clinical Impressions(s) / ED Diagnoses   Final diagnoses:  Sleep walking    ED Discharge Orders    None       Garlon HatchetSanders, Estell Puccini M, PA-C 03/09/17 0542    Dione BoozeGlick, David, MD 03/09/17 [phone]

## 2017-03-13 MED FILL — ?PHENYTOIN SOD EXT 100 MG C: 100 | 30 days supply | Qty: 90 | Fill #0

## 2017-03-14 ENCOUNTER — Encounter (HOSPITAL_COMMUNITY): Payer: Self-pay | Admitting: Emergency Medicine

## 2017-03-14 ENCOUNTER — Other Ambulatory Visit: Payer: Self-pay

## 2017-03-14 DIAGNOSIS — M25551 Pain in right hip: Secondary | ICD-10-CM | POA: Insufficient documentation

## 2017-03-14 DIAGNOSIS — G8929 Other chronic pain: Secondary | ICD-10-CM | POA: Insufficient documentation

## 2017-03-14 DIAGNOSIS — M25552 Pain in left hip: Secondary | ICD-10-CM | POA: Insufficient documentation

## 2017-03-14 DIAGNOSIS — Z79899 Other long term (current) drug therapy: Secondary | ICD-10-CM | POA: Insufficient documentation

## 2017-03-14 DIAGNOSIS — G4700 Insomnia, unspecified: Secondary | ICD-10-CM | POA: Insufficient documentation

## 2017-03-14 NOTE — ED Triage Notes (Signed)
Pt is requesting physical therapy for the pain in his hips and some pain relievers

## 2017-03-14 NOTE — ED Triage Notes (Signed)
Pt brought in by EMS for c/o arthritis pain been hurting for 7 days  Pt is out of his medication  [facility]

## 2017-03-15 ENCOUNTER — Emergency Department (HOSPITAL_COMMUNITY)
Admission: EM | Admit: 2017-03-15 | Discharge: 2017-03-15 | Disposition: A | Discharge status: Home / Self Care | Payer: Self-pay | Attending: Emergency Medicine | Admitting: Emergency Medicine

## 2017-03-15 ENCOUNTER — Other Ambulatory Visit: Payer: Self-pay | Admitting: Family Medicine

## 2017-03-15 DIAGNOSIS — M25551 Pain in right hip: Secondary | ICD-10-CM

## 2017-03-15 DIAGNOSIS — M25552 Pain in left hip: Secondary | ICD-10-CM

## 2017-03-15 DIAGNOSIS — G8929 Other chronic pain: Secondary | ICD-10-CM

## 2017-03-15 DIAGNOSIS — M175 Other unilateral secondary osteoarthritis of knee: Secondary | ICD-10-CM

## 2017-03-15 MED ORDER — DICLOFENAC SODIUM 1 % TD GEL: 4.0000 g | Freq: Four times a day (QID) | TRANSDERMAL | 0 refills | Status: DC

## 2017-03-15 NOTE — ED Provider Notes (Signed)
[facility]-EMERGENCY DEPT Provider Note   CSN: [id number] Arrival date & time: 03/14/17  2017     History   Chief Complaint Chief Complaint  Patient presents with  . Hip Pain    HPI Michael Shea is a 61 y.o. male.  Patient presents again to the emergency department for assistance with hip pain known, per patient, to be chronic arthritis pain. No recent injury. He states his pain is usually left sided but has started hurting on the right. No back pain. He uses Voltaren Gel with relief but reports he is out of this medication. He is requesting a referral for physical therapy.  He also reports he continues to walk in his sleep and feels he needs a sleep study. He states he used Clonidine for sleep in the past and feels this would help him now. No new problems or concerns.   The history is provided by the patient. No language interpreter was used.    Past Medical History:  Diagnosis Date  . Arthritis   . Seizures (HCC)   . Sleep walking     Patient Active Problem List   Diagnosis Date Noted  . Osteoarthritis 10/09/2016  . Seizures (HCC) 12/16/2015  . Chronic left hip pain 12/16/2015    History reviewed. No pertinent surgical history.     Home Medications    Prior to Admission medications   Medication Sig Start Date End Date Taking? Authorizing Provider  atorvastatin (LIPITOR) 20 MG tablet Take 1 tablet (20 mg total) by mouth daily. 10/10/16   Jaclyn ShaggyAmao, Enobong, MD  cetirizine (ZYRTEC) 10 MG tablet Take 1 tablet (10 mg total) by mouth daily. 10/11/16   Jaclyn ShaggyAmao, Enobong, MD  cloNIDine (CATAPRES) 0.1 MG tablet TAKE 1 TABLET BY MOUTH AT BEDTIME FOR PARASOMNIA 02/28/17   Quentin AngstJegede, Olugbemiga E, MD  diclofenac sodium (VOLTAREN) 1 % GEL Apply 4 g 4 (four) times daily topically. 03/15/17   Elpidio AnisUpstill, Alizzon Dioguardi, PA-C  ergocalciferol (DRISDOL) 50000 units capsule Take 1 capsule (50,000 Units total) by mouth once a week. Patient taking differently: Take 50,000 Units every  Monday by mouth.  10/10/16   Jaclyn ShaggyAmao, Enobong, MD  hydroxypropyl methylcellulose / hypromellose (ISOPTO TEARS / GONIOVISC) 2.5 % ophthalmic solution Place 1 drop 3 (three) times daily as needed into both eyes for dry eyes.    [provider]  meclizine (ANTIVERT) 25 MG tablet Take 1 tablet (25 mg total) by mouth 3 (three) times daily as needed. Patient not taking: Reported on 03/09/2017 10/23/16   Jaclyn ShaggyAmao, Enobong, MD  Menthol-Methyl Salicylate (THERA-GESIC) 1-15 % CREA Apply 1 Dose topically 2 (two) times daily. 12/03/15   Anders SimmondsMcClung, Angela M, PA-C  phenytoin (DILANTIN) 100 MG ER capsule Take 3 capsules (300 mg total) by mouth at bedtime. 02/27/17 03/29/17  Audry PiliMohr, Tyler, PA-C  phenytoin (DILANTIN) 100 MG ER capsule TAKE 3 CAPSULES BY MOUTH AT BEDTIME. 03/13/17   Jaclyn ShaggyAmao, Enobong, MD    Family History Family History  Problem Relation Age of Onset  . Heart failure Mother   . Diabetes Father     Social History Social History   Tobacco Use  . Smoking status: Never Smoker  . Smokeless tobacco: Never Used  Substance Use Topics  . Alcohol use: No  . Drug use: No     Allergies   Patient has no known allergies.   Review of Systems Review of Systems  Constitutional: Negative for chills and fever.  Respiratory: Negative.   Cardiovascular: Negative.   Gastrointestinal: Negative.  Musculoskeletal:       See HPI.  Skin: Negative.   Neurological: Negative.   Psychiatric/Behavioral: Positive for sleep disturbance.     Physical Exam Updated Vital Signs BP 114/74 (BP Location: Right Arm)   Pulse (!) 55   Temp 97.9 F (36.6 C) (Oral)   Resp 18   SpO2 96%   Physical Exam  Constitutional: He is oriented to person, place, and time. He appears well-developed and well-nourished.  HENT:  Head: Normocephalic.  Neck: Normal range of motion. Neck supple.  Cardiovascular: Normal rate and regular rhythm.  Pulmonary/Chest: Effort normal and breath sounds normal. He has no wheezes. He has no  rales.  Abdominal: Soft. Bowel sounds are normal. There is no tenderness. There is no rebound and no guarding.  Musculoskeletal: Normal range of motion.  FROM all extremities. Left < right hip tenderness without swelling, redness of joints.   Neurological: He is alert and oriented to person, place, and time.  Skin: Skin is warm and dry. No rash noted.  Psychiatric: He has a normal mood and affect.     ED Treatments / Results  Labs (all labs ordered are listed, but only abnormal results are displayed) Labs Reviewed - No data to display  EKG  EKG Interpretation None       Radiology No results found.  Procedures Procedures (including critical care time)  Medications Ordered in ED Medications - No data to display   Initial Impression / Assessment and Plan / ED Course  I have reviewed the triage vital signs and the nursing notes.  Pertinent labs & imaging results that were available during my care of the patient were reviewed by me and considered in my medical decision making (see chart for details).     Patient presents with chronic hip pain and ongoing sleep difficulty. Discussed importance of primary care follow up and involvement. Can provide referral to orthopedics for consideration of PT and joint pain management. Discussed sleep difficulties and chronic joint pain is best managed by PCP.     Final Clinical Impressions(s) / ED Diagnoses   Final diagnoses:  Chronic pain of both hips  Sleep disturbance  ED Discharge Orders        Ordered    diclofenac sodium (VOLTAREN) 1 % GEL  4 times daily     03/15/17 0456       Elpidio AnisUpstill, Derwood Becraft, PA-C 03/15/17 [phone]    Palumbo, April, MD 03/15/17 [phone]

## 2017-03-19 ENCOUNTER — Other Ambulatory Visit: Payer: Self-pay | Admitting: Family Medicine

## 2017-03-19 DIAGNOSIS — M175 Other unilateral secondary osteoarthritis of knee: Secondary | ICD-10-CM

## 2017-03-19 MED FILL — VIT D2 1.25 MG (50,000 UNIT: 1.25 MG | 6 days supply | Qty: 1 | Fill #3

## 2017-03-19 MED FILL — VOLTAREN 1% GEL: 1 | 6 days supply | Qty: 100 | Fill #0

## 2017-03-24 ENCOUNTER — Encounter (HOSPITAL_COMMUNITY): Payer: Self-pay

## 2017-03-24 ENCOUNTER — Emergency Department (HOSPITAL_COMMUNITY)
Admission: EM | Admit: 2017-03-24 | Discharge: 2017-03-24 | Disposition: A | Discharge status: Home / Self Care | Payer: Medicaid Other | Attending: Emergency Medicine | Admitting: Emergency Medicine

## 2017-03-24 DIAGNOSIS — M175 Other unilateral secondary osteoarthritis of knee: Secondary | ICD-10-CM

## 2017-03-24 DIAGNOSIS — M25551 Pain in right hip: Secondary | ICD-10-CM | POA: Insufficient documentation

## 2017-03-24 DIAGNOSIS — G8929 Other chronic pain: Secondary | ICD-10-CM | POA: Insufficient documentation

## 2017-03-24 DIAGNOSIS — M25552 Pain in left hip: Secondary | ICD-10-CM

## 2017-03-24 DIAGNOSIS — Z79899 Other long term (current) drug therapy: Secondary | ICD-10-CM | POA: Insufficient documentation

## 2017-03-24 MED ORDER — DICLOFENAC SODIUM 1 % TD GEL: 4.0000 g | Freq: Four times a day (QID) | TRANSDERMAL | 0 refills | Status: DC

## 2017-03-24 NOTE — Discharge Instructions (Signed)
You were seen here today for chronic joint pain.  I prescribed the Voltaren gel that has worked for you in the past.  Please follow with orthopedics or PCP for further evaluation and possible referral to physical therapy. If you develop worsening or new concerning symptoms you can return to the emergency department for re-evaluation.   Additional Information:  Your vital signs today were: BP (!) 106/91 (BP Location: Right Arm)    Pulse 67    Temp 98.2 F (36.8 C) (Oral)    Resp 18    SpO2 97%  If your blood pressure (BP) was elevated above 135/85 this visit, please have this repeated by your doctor within one month. ---------------

## 2017-03-24 NOTE — ED Provider Notes (Signed)
[facility] EM[facility]ENC[facility]PART[facility] Provider Note   CSN: [id number] Arrival date & time: 03/24/17  1316     History   Chief Complaint No chief complaint on file.   HPI Michael Shea is a 61 y.o. male with a history of arthritis who presents to the emergency department today for chronic arthralgias.  Patient states that he has chronic arthritis of bilateral hips.  He has been seen by orthopedics in the past where he has had physical therapy that provided much relief.  He has used Voltaren gel in the past with relief but is out of the medication.  He is requesting a referral to physical therapy and a refill on the medication.  He denies any recent trauma or injury.  Pain is worse in the morning and improves after walking for some time.  Is also worse with the cold weather and when raining outside.  Patient denies any fever, chills, back pain, bowel or bladder incontinence, urinary retention, saddle anesthesia, numbness/tingling/weakness of the extremities, falls, joint swelling or redness.  HPI  Past Medical History:  Diagnosis Date  . Arthritis   . Seizures (HCC)   . Sleep walking     Patient Active Problem List   Diagnosis Date Noted  . Osteoarthritis 10/09/2016  . Seizures (HCC) 12/16/2015  . Chronic left hip pain 12/16/2015    History reviewed. No pertinent surgical history.     Home Medications    Prior to Admission medications   Medication Sig Start Date End Date Taking? Authorizing Provider  atorvastatin (LIPITOR) 20 MG tablet Take 1 tablet (20 mg total) by mouth daily. 10/10/16   Jaclyn ShaggyAmao, Enobong, MD  cetirizine (ZYRTEC) 10 MG tablet Take 1 tablet (10 mg total) by mouth daily. 10/11/16   Jaclyn ShaggyAmao, Enobong, MD  cloNIDine (CATAPRES) 0.1 MG tablet TAKE 1 TABLET BY MOUTH AT BEDTIME FOR PARASOMNIA 02/28/17   Quentin AngstJegede, Olugbemiga E, MD  diclofenac sodium (VOLTAREN) 1 % GEL Apply 4 g 4 (four) times daily topically. 03/15/17   Elpidio AnisUpstill, Shari, PA-C  ergocalciferol  (DRISDOL) 50000 units capsule Take 1 capsule (50,000 Units total) by mouth once a week. Patient taking differently: Take 50,000 Units every Monday by mouth.  10/10/16   Jaclyn ShaggyAmao, Enobong, MD  hydroxypropyl methylcellulose / hypromellose (ISOPTO TEARS / GONIOVISC) 2.5 % ophthalmic solution Place 1 drop 3 (three) times daily as needed into both eyes for dry eyes.    [provider]  meclizine (ANTIVERT) 25 MG tablet Take 1 tablet (25 mg total) by mouth 3 (three) times daily as needed. Patient not taking: Reported on 03/09/2017 10/23/16   Jaclyn ShaggyAmao, Enobong, MD  Menthol-Methyl Salicylate (THERA-GESIC) 1-15 % CREA Apply 1 Dose topically 2 (two) times daily. 12/03/15   Anders SimmondsMcClung, Angela M, PA-C  phenytoin (DILANTIN) 100 MG ER capsule Take 3 capsules (300 mg total) by mouth at bedtime. 02/27/17 03/29/17  Audry PiliMohr, Tyler, PA-C  phenytoin (DILANTIN) 100 MG ER capsule TAKE 3 CAPSULES BY MOUTH AT BEDTIME. 03/13/17   Amao, Enobong, MD  VOLTAREN 1 % GEL APPLY 4 G TOPICALLY 4 (FOUR) TIMES DAILY. 03/19/17   Jaclyn ShaggyAmao, Enobong, MD    Family History Family History  Problem Relation Age of Onset  . Heart failure Mother   . Diabetes Father     Social History Social History   Tobacco Use  . Smoking status: Never Smoker  . Smokeless tobacco: Never Used  Substance Use Topics  . Alcohol use: No  . Drug use: No     Allergies  Patient has no known allergies.   Review of Systems Review of Systems  Constitutional: Negative for chills and fever.  Musculoskeletal: Positive for arthralgias. Negative for joint swelling.  Skin: Negative for color change.  Neurological: Negative for weakness and numbness.  All other systems reviewed and are negative.    Physical Exam Updated Vital Signs BP (!) 106/91 (BP Location: Right Arm)   Pulse 67   Temp 98.2 F (36.8 C) (Oral)   Resp 18   SpO2 97%   Physical Exam  Constitutional: He appears well-developed and well-nourished.  HENT:  Head: Normocephalic and atraumatic.   Right Ear: External ear normal.  Left Ear: External ear normal.  Eyes: Conjunctivae are normal. Right eye exhibits no discharge. Left eye exhibits no discharge. No scleral icterus.  Cardiovascular:  Pulses:      Femoral pulses are 2+ on the right side, and 2+ on the left side.      Dorsalis pedis pulses are 2+ on the right side, and 2+ on the left side.       Posterior tibial pulses are 2+ on the right side, and 2+ on the left side.  No lower extremity swelling.  Calves are symmetric in size bilaterally.  No tenderness palpation over the calves.  Pulmonary/Chest: Effort normal. No respiratory distress.  Musculoskeletal:       Right hip: He exhibits tenderness.       Left hip: He exhibits tenderness.       Right knee: No tenderness found.       Left knee: No tenderness found. No medial joint line and no lateral joint line tenderness noted.  Full range of motion of bilateral hips, knees and ankles.  No sacral crepitus.  Negative logroll test bilaterally.  Negative Lachman's test bilaterally.  There is some crepitus of knees bilaterally with range of motion.  Negative anterior and posterior drawer test bilaterally.  No varus or valgus laxity or locking.  Compartment soft of the lower extremity.  Patient is neurovascularly intact.  Patient can walk without difficulty.  Neurological: He is alert.  Skin: Skin is warm, dry and intact. No erythema. No pallor.  No skin erythema or joint swelling.  Psychiatric: He has a normal mood and affect.  Nursing note and vitals reviewed.    ED Treatments / Results  Labs (all labs ordered are listed, but only abnormal results are displayed) Labs Reviewed - No data to display  EKG  EKG Interpretation None       Radiology No results found.  Procedures Procedures (including critical care time)  Medications Ordered in ED Medications - No data to display   Initial Impression / Assessment and Plan / ED Course  I have reviewed the triage vital  signs and the nursing notes.  Pertinent labs & imaging results that were available during my care of the patient were reviewed by me and considered in my medical decision making (see chart for details).     Patient here for chronic lower extremity pain.  Has been here for same in past. History of arthritis.  Appears to be the same.  No recent injury.  No concern for septic joint at this time as the patient is without fever, joint swelling, decreased range of motion or erythema over the joints.  He is requesting Voltaren gel refill which I will do at this time.  He is requesting referral to physical therapy, will provide referral to orthopedics for further discussion of physical therapy versus other methods  of treatment.  Encourage patient to follow-up with his PCP as well for chronic treatment of his symptoms.  Patient is agreement with plan and appears safe for discharge.  Final Clinical Impressions(s) / ED Diagnoses   Final diagnoses:  Chronic pain of both hips    ED Discharge Orders    None       Princella PellegriniMaczis, Frederic Tones M, PA-C 03/24/17 1738    Lorre NickAllen, Anthony, MD 03/24/17 2102

## 2017-03-24 NOTE — ED Triage Notes (Signed)
Patient complains of general joint pain for weeks, states he needs meds for his arthritis, NAD

## 2017-03-24 NOTE — ED Notes (Signed)
Declined W/C at D/C and was escorted to lobby by RN. 

## 2017-03-26 ENCOUNTER — Ambulatory Visit: Payer: Self-pay | Attending: Family Medicine | Admitting: Physician Assistant

## 2017-03-26 VITALS — BP 123/82 | HR 67 | Temp 98.6°F | Resp 22 | Wt 231.8 lb

## 2017-03-26 DIAGNOSIS — Z79899 Other long term (current) drug therapy: Secondary | ICD-10-CM | POA: Insufficient documentation

## 2017-03-26 DIAGNOSIS — G40909 Epilepsy, unspecified, not intractable, without status epilepticus: Secondary | ICD-10-CM | POA: Insufficient documentation

## 2017-03-26 DIAGNOSIS — E785 Hyperlipidemia, unspecified: Secondary | ICD-10-CM | POA: Insufficient documentation

## 2017-03-26 DIAGNOSIS — Z9119 Patient's noncompliance with other medical treatment and regimen: Secondary | ICD-10-CM | POA: Insufficient documentation

## 2017-03-26 DIAGNOSIS — M1612 Unilateral primary osteoarthritis, left hip: Secondary | ICD-10-CM | POA: Insufficient documentation

## 2017-03-26 DIAGNOSIS — G4759 Other parasomnia: Secondary | ICD-10-CM

## 2017-03-26 DIAGNOSIS — Z760 Encounter for issue of repeat prescription: Secondary | ICD-10-CM | POA: Insufficient documentation

## 2017-03-26 DIAGNOSIS — M25552 Pain in left hip: Secondary | ICD-10-CM | POA: Insufficient documentation

## 2017-03-26 DIAGNOSIS — G4750 Parasomnia, unspecified: Secondary | ICD-10-CM | POA: Insufficient documentation

## 2017-03-26 DIAGNOSIS — M25551 Pain in right hip: Secondary | ICD-10-CM | POA: Insufficient documentation

## 2017-03-26 DIAGNOSIS — M160 Bilateral primary osteoarthritis of hip: Secondary | ICD-10-CM

## 2017-03-26 MED ORDER — ERGOCALCIFEROL 1.25 MG (50000 UT) PO CAPS: 50000.0000 [IU] | ORAL_CAPSULE | ORAL | 1 refills | Status: DC

## 2017-03-26 MED ORDER — DICLOFENAC SODIUM 1 % TD GEL: 4.0000 g | Freq: Four times a day (QID) | TRANSDERMAL | 0 refills | Status: DC

## 2017-03-26 MED ORDER — CLONIDINE HCL 0.1 MG PO TABS: 0.1000 mg | ORAL_TABLET | Freq: Every day | ORAL | 1 refills | Status: DC

## 2017-03-26 MED FILL — ?CLONIDINE HCL 0.1 MG TABL: 0.1 | 30 days supply | Qty: 30 | Fill #0

## 2017-03-26 NOTE — Progress Notes (Signed)
Needs referral for sleep study

## 2017-03-26 NOTE — Progress Notes (Signed)
Chief Complaint: ED follow up  Subjective: This is a 61 year old male with a history of hyperlipidemia, seizure disorder, osteoarthritis and parasomnia. He has had 5 emergency department visits in the last month. A couple were just for medication refills. The others were for osteoarthritis of the hip. He chronically has osteoarthritis of the left hip but is now experiencing some discomfort on the right hip and is asking to be referred to physical therapy.   He also continues with parasomnia. He has had a sleep study in the past and has been told to wear a CPAP machine but he has been noncompliant with this. He is wanting to discuss his situation with the sleep center once again.  He needs a refill on Voltaren gel Clonidine and Vit D.   ROS:  GEN: denies fever or chills, denies change in weight Skin: denies lesions or rashes EXT: denies muscle spasms or swelling; + pain in lower ext, no weakness. NEURO: denies numbness or tingling, denies sz, stroke or TIA   Objective:  Vitals:   03/26/17 0858  BP: 123/82  Pulse: 67  Resp: (!) 22  Temp: 98.6 F (37 C)  TempSrc: Oral  SpO2: 96%  Weight: 231 lb 12.8 oz (105.1 kg)    Physical Exam:  General: in no acute distress. Extremities: No clubbing cyanosis or edema with positive pedal pulses. Good ROM of lower ext. Non tender to palp. Neuro: Alert, awake, oriented x3, nonfocal.    Medications: Prior to Admission medications   Medication Sig Start Date End Date Taking? Authorizing Provider  cetirizine (ZYRTEC) 10 MG tablet Take 1 tablet (10 mg total) by mouth daily. 10/11/16  Yes Jaclyn ShaggyAmao, Enobong, MD  cloNIDine (CATAPRES) 0.1 MG tablet Take 1 tablet (0.1 mg total) by mouth at bedtime. 03/26/17  Yes Danelle EarthlyNoel, Tiffany S, PA-C  diclofenac sodium (VOLTAREN) 1 % GEL Apply 4 g topically 4 (four) times daily. 03/26/17  Yes Danelle EarthlyNoel, Tiffany S, PA-C  ergocalciferol (DRISDOL) 50000 units capsule Take 1 capsule (50,000 Units total) by mouth once a week.  03/26/17  Yes Danelle EarthlyNoel, Tiffany S, PA-C  hydroxypropyl methylcellulose / hypromellose (ISOPTO TEARS / GONIOVISC) 2.5 % ophthalmic solution Place 1 drop 3 (three) times daily as needed into both eyes for dry eyes.   Yes [provider]  meclizine (ANTIVERT) 25 MG tablet Take 1 tablet (25 mg total) by mouth 3 (three) times daily as needed. 10/23/16  Yes Jaclyn ShaggyAmao, Enobong, MD  Menthol-Methyl Salicylate (THERA-GESIC) 1-15 % CREA Apply 1 Dose topically 2 (two) times daily. 12/03/15  Yes Anders SimmondsMcClung, Angela M, PA-C  phenytoin (DILANTIN) 100 MG ER capsule Take 3 capsules (300 mg total) by mouth at bedtime. 02/27/17 03/29/17 Yes Audry PiliMohr, Tyler, PA-C  phenytoin (DILANTIN) 100 MG ER capsule TAKE 3 CAPSULES BY MOUTH AT BEDTIME. 03/13/17  Yes Amao, Enobong, MD  atorvastatin (LIPITOR) 20 MG tablet Take 1 tablet (20 mg total) by mouth daily. Patient not taking: Reported on 03/26/2017 10/10/16   Jaclyn ShaggyAmao, Enobong, MD    Assessment: 1. Acute on Chronic bilateral hip apin 2. Parasomnia 3. Medication Refill Request  Plan: Referral to sleep medicine Referral to PT Refilled Voltaren gel Clonidine and Vit D  Follow up:4 weeks with Dr. Venetia NightAmao  The patient was given clear instructions to go to ER or return to medical center if symptoms don't improve, worsen or new problems develop. The patient verbalized understanding. The patient was told to call to get lab results if they haven't heard anything in the next week.  This note has been created with Education officer, environmentalDragon speech recognition software and smart phrase technology. Any transcriptional errors are unintentional.   Scot Juniffany Noel, PA-C 03/26/2017, 9:44 AM

## 2017-03-27 ENCOUNTER — Emergency Department (HOSPITAL_COMMUNITY)
Admission: EM | Admit: 2017-03-27 | Discharge: 2017-03-27 | Disposition: A | Discharge status: Home / Self Care | Payer: Medicaid Other | Attending: Emergency Medicine | Admitting: Emergency Medicine

## 2017-03-27 ENCOUNTER — Encounter (HOSPITAL_COMMUNITY): Payer: Self-pay

## 2017-03-27 DIAGNOSIS — Z79899 Other long term (current) drug therapy: Secondary | ICD-10-CM | POA: Insufficient documentation

## 2017-03-27 DIAGNOSIS — R569 Unspecified convulsions: Secondary | ICD-10-CM | POA: Insufficient documentation

## 2017-03-27 LAB — COMPREHENSIVE METABOLIC PANEL
ALK PHOS: 114 U/L (ref 38–126)
ALT: 20 U/L (ref 17–63)
AST: 22 U/L (ref 15–41)
Albumin: 4.2 g/dL (ref 3.5–5.0)
Anion gap: 6 (ref 5–15)
BILIRUBIN TOTAL: 0.4 mg/dL (ref 0.3–1.2)
BUN: 14 mg/dL (ref 6–20)
CALCIUM: 9.2 mg/dL (ref 8.9–10.3)
CHLORIDE: 104 mmol/L (ref 101–111)
CO2: 29 mmol/L (ref 22–32)
CREATININE: 0.67 mg/dL (ref 0.61–1.24)
Glucose, Bld: 124 mg/dL — ABNORMAL HIGH (ref 65–99)
Potassium: 4.6 mmol/L (ref 3.5–5.1)
Sodium: 139 mmol/L (ref 135–145)
TOTAL PROTEIN: 7.3 g/dL (ref 6.5–8.1)

## 2017-03-27 LAB — CBC
HEMATOCRIT: 45.5 % (ref 39.0–52.0)
HEMOGLOBIN: 15.5 g/dL (ref 13.0–17.0)
MCH: 31.4 pg (ref 26.0–34.0)
MCHC: 34.1 g/dL (ref 30.0–36.0)
MCV: 92.3 fL (ref 78.0–100.0)
Platelets: 229 10*3/uL (ref 150–400)
RBC: 4.93 MIL/uL (ref 4.22–5.81)
RDW: 13.9 % (ref 11.5–15.5)
WBC: 11.2 10*3/uL — AB (ref 4.0–10.5)

## 2017-03-27 LAB — PHENYTOIN LEVEL, TOTAL: PHENYTOIN LVL: 11.1 ug/mL (ref 10.0–20.0)

## 2017-03-27 NOTE — ED Triage Notes (Signed)
Patient is out of his Dilantin. Patient also sleepwalks.

## 2017-03-27 NOTE — ED Notes (Signed)
Bed: WA15 Expected date:  Expected time:  Means of arrival:  Comments: EMS- seizure 

## 2017-03-27 NOTE — ED Notes (Signed)
Pt refused to sign.  

## 2017-03-27 NOTE — ED Triage Notes (Signed)
[facility] because patient was asleep and he woke up waving his arms around and "throwing stuff around". Patient speaks broken [place] but reported he took "a pill" to sleep, unknown what the pill was.

## 2017-03-27 NOTE — ED Provider Notes (Signed)
[facility]-EMERGENCY DEPT Provider Note   CSN: [id number] Arrival date & time: 03/27/17  0020     History   Chief Complaint Chief Complaint  Patient presents with  . Altered Mental Status    HPI Michael Shea is a 61 y.o. male.  HPI Patient is a 61 year old male with a history of seizures for which he takes Dilantin.  [facility] where he was reportedly having seizure-like activity.  He feels much better at this time.  Denies fevers and chills.  Denies neck pain.  Denies weakness of his arms or legs.  Patient reports he has been compliant with his Dilantin but nursing reports states he is out of his Dilantin.  No significant headache at this time   Past Medical History:  Diagnosis Date  . Arthritis   . Seizures (HCC)   . Sleep walking     Patient Active Problem List   Diagnosis Date Noted  . Osteoarthritis 10/09/2016  . Seizures (HCC) 12/16/2015  . Chronic left hip pain 12/16/2015    History reviewed. No pertinent surgical history.     Home Medications    Prior to Admission medications   Medication Sig Start Date End Date Taking? Authorizing Provider  atorvastatin (LIPITOR) 20 MG tablet Take 1 tablet (20 mg total) by mouth daily. 10/10/16  Yes Jaclyn ShaggyAmao, Enobong, MD  cetirizine (ZYRTEC) 10 MG tablet Take 1 tablet (10 mg total) by mouth daily. 10/11/16  Yes Jaclyn ShaggyAmao, Enobong, MD  cloNIDine (CATAPRES) 0.1 MG tablet Take 1 tablet (0.1 mg total) by mouth at bedtime. 03/26/17  Yes Danelle EarthlyNoel, Tiffany S, PA-C  diclofenac sodium (VOLTAREN) 1 % GEL Apply 4 g topically 4 (four) times daily. 03/26/17  Yes Danelle EarthlyNoel, Tiffany S, PA-C  ergocalciferol (DRISDOL) 50000 units capsule Take 1 capsule (50,000 Units total) by mouth once a week. 03/26/17  Yes Danelle EarthlyNoel, Tiffany S, PA-C  hydroxypropyl methylcellulose / hypromellose (ISOPTO TEARS / GONIOVISC) 2.5 % ophthalmic solution Place 1 drop 3 (three) times daily as needed into both eyes for dry  eyes.   Yes [provider]  Menthol-Methyl Salicylate (THERA-GESIC) 1-15 % CREA Apply 1 Dose topically 2 (two) times daily. 12/03/15  Yes Anders SimmondsMcClung, Angela M, PA-C  phenytoin (DILANTIN) 100 MG ER capsule Take 3 capsules (300 mg total) by mouth at bedtime. 02/27/17 03/29/17 Yes Audry PiliMohr, Tyler, PA-C    Family History Family History  Problem Relation Age of Onset  . Heart failure Mother   . Diabetes Father     Social History Social History   Tobacco Use  . Smoking status: Never Smoker  . Smokeless tobacco: Never Used  Substance Use Topics  . Alcohol use: No  . Drug use: No     Allergies   Patient has no known allergies.   Review of Systems Review of Systems  All other systems reviewed and are negative.    Physical Exam Updated Vital Signs BP (!) 148/80 (BP Location: Right Arm)   Pulse 61   Temp 98.1 F (36.7 C) (Oral)   Resp 16   Ht 5\' 9"  (1.753 m)   Wt 104.8 kg (231 lb)   SpO2 97%   BMI 34.11 kg/m   Physical Exam  Constitutional: He appears well-developed and well-nourished.  HENT:  Head: Normocephalic and atraumatic.  Eyes: EOM are normal. Pupils are equal, round, and reactive to light.  Neck: Normal range of motion.  Cardiovascular: Normal rate and regular rhythm.  Pulmonary/Chest: Effort normal.  Abdominal: Soft. He exhibits no distension.  Musculoskeletal: Normal range of motion.  Neurological: He is alert.  5/5 strength in major muscle groups of  bilateral upper and lower extremities. Speech normal. No facial asymetry.   Psychiatric: He has a normal mood and affect.  Nursing note and vitals reviewed.    ED Treatments / Results  Labs (all labs ordered are listed, but only abnormal results are displayed) Labs Reviewed  CBC - Abnormal; Notable for the following components:      Result Value   WBC 11.2 (*)    All other components within normal limits  COMPREHENSIVE METABOLIC PANEL - Abnormal; Notable for the following components:   Glucose,  Bld 124 (*)    All other components within normal limits  PHENYTOIN LEVEL, TOTAL    EKG  EKG Interpretation None       Radiology No results found.  Procedures Procedures (including critical care time)  Medications Ordered in ED Medications - No data to display   Initial Impression / Assessment and Plan / ED Course  I have reviewed the triage vital signs and the nursing notes.  Pertinent labs & imaging results that were available during my care of the patient were reviewed by me and considered in my medical decision making (see chart for details).     4:37 AM Patient feels much better this time.  No seizure here in the emergency department.  Dilantin level is therapeutic.  Discharged home in good condition.  Primary care follow-up.  Patient understands return to the ER for new or worsening symptoms  Final Clinical Impressions(s) / ED Diagnoses   Final diagnoses:  Seizure [facility](HCC)    ED Discharge Orders    None       Azalia Bilisampos, Maurice Fotheringham, MD 03/27/17 [id number]

## 2017-03-27 NOTE — ED Notes (Signed)
Ambulatory to bathroom with no assist.  

## 2017-04-03 MED FILL — VIT D2 1.25 MG (50,000 UNIT: 1.25 MG | 28 days supply | Qty: 4 | Fill #0

## 2017-04-09 ENCOUNTER — Inpatient Hospital Stay: Payer: Medicaid Other | Admitting: Family Medicine

## 2017-04-10 ENCOUNTER — Encounter: Payer: Self-pay | Admitting: Pediatric Intensive Care

## 2017-04-10 MED FILL — PHENYTOIN SOD EXT 100 MG CA: 100 | 30 days supply | Qty: 90 | Fill #0

## 2017-04-10 MED FILL — DICLOFENAC SODIUM 1% GEL: 1 | 20 days supply | Qty: 100 | Fill #0

## 2017-04-11 ENCOUNTER — Telehealth: Payer: Self-pay | Admitting: Family Medicine

## 2017-04-11 NOTE — Telephone Encounter (Signed)
Patients appointment was approved by [place] to be on 04/20/2017 at 10:30. Pt was called back but not reached.

## 2017-04-12 ENCOUNTER — Emergency Department (HOSPITAL_COMMUNITY)
Admission: EM | Admit: 2017-04-12 | Discharge: 2017-04-12 | Disposition: A | Discharge status: Home / Self Care | Payer: Self-pay | Attending: Emergency Medicine | Admitting: Emergency Medicine

## 2017-04-12 ENCOUNTER — Encounter (HOSPITAL_COMMUNITY): Payer: Self-pay | Admitting: Emergency Medicine

## 2017-04-12 ENCOUNTER — Emergency Department (HOSPITAL_COMMUNITY): Payer: Self-pay

## 2017-04-12 ENCOUNTER — Emergency Department (HOSPITAL_COMMUNITY)
Admission: EM | Admit: 2017-04-12 | Discharge: 2017-04-13 | Disposition: A | Discharge status: Home / Self Care | Payer: Medicaid Other | Attending: Emergency Medicine | Admitting: Emergency Medicine

## 2017-04-12 ENCOUNTER — Other Ambulatory Visit: Payer: Self-pay

## 2017-04-12 DIAGNOSIS — M25552 Pain in left hip: Secondary | ICD-10-CM | POA: Insufficient documentation

## 2017-04-12 DIAGNOSIS — Z79899 Other long term (current) drug therapy: Secondary | ICD-10-CM | POA: Insufficient documentation

## 2017-04-12 DIAGNOSIS — R569 Unspecified convulsions: Secondary | ICD-10-CM | POA: Insufficient documentation

## 2017-04-12 DIAGNOSIS — G8929 Other chronic pain: Secondary | ICD-10-CM

## 2017-04-12 DIAGNOSIS — M25551 Pain in right hip: Secondary | ICD-10-CM | POA: Insufficient documentation

## 2017-04-12 LAB — CBG MONITORING, ED: GLUCOSE-CAPILLARY: 100 mg/dL — AB (ref 65–99)

## 2017-04-12 MED ORDER — KETOROLAC TROMETHAMINE 30 MG/ML IJ SOLN
30.0000 mg | Freq: Once | INTRAMUSCULAR | Status: AC
  Administered 2017-04-12: 30 mg via INTRAMUSCULAR
  Filled 2017-04-12: qty 1

## 2017-04-12 MED ORDER — DICLOFENAC SODIUM 1 % TD GEL
4.0000 g | Freq: Four times a day (QID) | TRANSDERMAL | 0 refills | Status: DC
Start: 2017-04-12 — End: 2017-04-20
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
## 2017-04-12 NOTE — Discharge Instructions (Signed)
Apply Voltaren gel 4 times daily as needed.  Please follow-up with the orthopedic as outlined below.  You may need to call your primary care provider, Dr. Venetia NightAmao, as some orthopedic offices require an official referral, which we cannot do from the emergency department.  Please return to the emergency department if you develop any new or worsening symptoms.
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
## 2017-04-12 NOTE — ED Triage Notes (Signed)
Pt brought in from [facility] via EMS after pt had a witnessed seizure  Pt remains postictal upon arrival  Pt has hx of seizures and is currently on dilantin  Pt did not bring his phone, clothes, or medications with him  [facility]  Pt had shoes and a blue towel upon arrival

## 2017-04-12 NOTE — ED Notes (Signed)
Bed: ZO10WA12 Expected date:  Expected time:  Means of arrival:  Comments: 61 yo M  Seizure

## 2017-04-13 LAB — BASIC METABOLIC PANEL WITH GFR
Anion gap: 8 (ref 5–15)
BUN: 20 mg/dL (ref 6–20)
CO2: 24 mmol/L (ref 22–32)
Calcium: 8.8 mg/dL — ABNORMAL LOW (ref 8.9–10.3)
Chloride: 105 mmol/L (ref 101–111)
Creatinine, Ser: 0.59 mg/dL — ABNORMAL LOW (ref 0.61–1.24)
GFR calc Af Amer: >60 mL/min (ref >60)
GFR calc non Af Amer: >60 mL/min (ref >60)
Glucose, Bld: 106 mg/dL — ABNORMAL HIGH (ref 65–99)
Potassium: 4.1 mmol/L (ref 3.5–5.1)
Sodium: 137 mmol/L (ref 135–145)

## 2017-04-13 LAB — CBC WITH DIFFERENTIAL/PLATELET
Basophils Absolute: 0 K/uL (ref 0.0–0.1)
Basophils Relative: 0 %
Eosinophils Absolute: 0.1 K/uL (ref 0.0–0.7)
Eosinophils Relative: 1 %
HCT: 42.4 % (ref 39.0–52.0)
Hemoglobin: 14.3 g/dL (ref 13.0–17.0)
Lymphocytes Relative: 20 %
Lymphs Abs: 2.1 K/uL (ref 0.7–4.0)
MCH: 31 pg (ref 26.0–34.0)
MCHC: 33.7 g/dL (ref 30.0–36.0)
MCV: 91.8 fL (ref 78.0–100.0)
Monocytes Absolute: 1 K/uL (ref 0.1–1.0)
Monocytes Relative: 9 %
Neutro Abs: 7.5 K/uL (ref 1.7–7.7)
Neutrophils Relative %: 70 %
Platelets: 207 K/uL (ref 150–400)
RBC: 4.62 MIL/uL (ref 4.22–5.81)
RDW: 13.8 % (ref 11.5–15.5)
WBC: 10.7 K/uL — ABNORMAL HIGH (ref 4.0–10.5)

## 2017-04-13 LAB — URINALYSIS, ROUTINE W REFLEX MICROSCOPIC
BILIRUBIN URINE: NEGATIVE
GLUCOSE, UA: NEGATIVE mg/dL
HGB URINE DIPSTICK: NEGATIVE
Ketones, ur: NEGATIVE mg/dL
Leukocytes, UA: NEGATIVE
Nitrite: NEGATIVE
Protein, ur: NEGATIVE mg/dL
SPECIFIC GRAVITY, URINE: 1.02 (ref 1.005–1.030)
pH: 5 (ref 5.0–8.0)

## 2017-04-13 LAB — PHENYTOIN LEVEL, TOTAL: Phenytoin Lvl: 7.8 ug/mL — ABNORMAL LOW (ref 10.0–20.0)

## 2017-04-13 MED ORDER — PHENYTOIN SODIUM EXTENDED 100 MG PO CAPS: 300.0000 mg | ORAL_CAPSULE | Freq: Once | ORAL | Status: DC

## 2017-04-13 MED ORDER — PHENYTOIN SODIUM EXTENDED 100 MG PO CAPS: 300.0000 mg | ORAL_CAPSULE | Freq: Every day | ORAL | 0 refills | Status: DC

## 2017-04-13 MED ORDER — LORAZEPAM 2 MG/ML IJ SOLN
INTRAMUSCULAR | Status: AC
  Administered 2017-04-13: 2 mg via INTRAVENOUS
  Filled 2017-04-13: qty 2

## 2017-04-13 MED ORDER — PHENYTOIN SODIUM 50 MG/ML IJ SOLN
150.0000 mg | Freq: Once | INTRAMUSCULAR | Status: AC
  Administered 2017-04-13: 150 mg via INTRAVENOUS
  Filled 2017-04-13: qty 4

## 2017-04-13 MED ORDER — LORAZEPAM 2 MG/ML IJ SOLN
2.0000 mg | Freq: Once | INTRAMUSCULAR | Status: AC
  Administered 2017-04-13: 2 mg via INTRAVENOUS

## 2017-04-13 NOTE — Discharge Instructions (Signed)
Continue your home dilantin.   Follow-up with your primary care doctor. Return here for any new/worsening symptoms.

## 2017-04-13 NOTE — ED Notes (Signed)
RN Huntley DecSara stated to Clinical research associatewriter "I will obtain labs from IV".

## 2017-04-13 NOTE — ED Provider Notes (Signed)
[facility]-EMERGENCY DEPT Provider Note   CSN: [id number] Arrival date & time: 04/12/17  2316     History   Chief Complaint Chief Complaint  Patient presents with  . Seizures    HPI Michael Shea is a 61 y.o. male.  The history is provided by the patient and medical records.  Seizures      61 year old male with history of arthritis, seizures, sleepwalking, presenting to the ED after having a witnessed seizure.  Patient resides at [facility].  Bystanders reports he had a tonic-clonic seizure and called EMS.  Upon EMS arrival he was postictal.  On my evaluation patient is awake, alert, appropriately oriented.  The patient is on home Dilantin, states he has been compliant and has not missed any doses of his medication.  He denies any illness, fever, chills, cough, or urinary symptoms.  Past Medical History:  Diagnosis Date  . Arthritis   . Seizures (HCC)   . Sleep walking     Patient Active Problem List   Diagnosis Date Noted  . Osteoarthritis 10/09/2016  . Seizures (HCC) 12/16/2015  . Chronic left hip pain 12/16/2015    History reviewed. No pertinent surgical history.     Home Medications    Prior to Admission medications   Medication Sig Start Date End Date Taking? Authorizing Provider  atorvastatin (LIPITOR) 20 MG tablet Take 1 tablet (20 mg total) by mouth daily. 10/10/16   Jaclyn ShaggyAmao, Enobong, MD  cetirizine (ZYRTEC) 10 MG tablet Take 1 tablet (10 mg total) by mouth daily. 10/11/16   Jaclyn ShaggyAmao, Enobong, MD  cloNIDine (CATAPRES) 0.1 MG tablet Take 1 tablet (0.1 mg total) by mouth at bedtime. 03/26/17   Vivianne MasterNoel, Tiffany S, PA-C  diclofenac sodium (VOLTAREN) 1 % GEL Apply 4 g topically 4 (four) times daily. 04/12/17   Emi HolesLaw, Alexandra M, PA-C  ergocalciferol (DRISDOL) 50000 units capsule Take 1 capsule (50,000 Units total) by mouth once a week. 03/26/17   Vivianne MasterNoel, Tiffany S, PA-C  hydroxypropyl methylcellulose / hypromellose (ISOPTO TEARS / GONIOVISC) 2.5 %  ophthalmic solution Place 1 drop 3 (three) times daily as needed into both eyes for dry eyes.    [provider]  Menthol-Methyl Salicylate (THERA-GESIC) 1-15 % CREA Apply 1 Dose topically 2 (two) times daily. 12/03/15   Anders SimmondsMcClung, Angela M, PA-C  phenytoin (DILANTIN) 100 MG ER capsule Take 3 capsules (300 mg total) by mouth at bedtime. 02/27/17 03/29/17  Audry PiliMohr, Tyler, PA-C    Family History Family History  Problem Relation Age of Onset  . Heart failure Mother   . Diabetes Father     Social History Social History   Tobacco Use  . Smoking status: Never Smoker  . Smokeless tobacco: Never Used  Substance Use Topics  . Alcohol use: No  . Drug use: No     Allergies   Patient has no known allergies.   Review of Systems Review of Systems  Neurological: Positive for seizures.  All other systems reviewed and are negative.    Physical Exam Updated Vital Signs BP 115/62 (BP Location: Left Arm)   Pulse 61   Temp 98.4 F (36.9 C) (Oral)   Resp 20   SpO2 93%   Physical Exam  Constitutional: He is oriented to person, place, and time. He appears well-developed and well-nourished. No distress.  HENT:  Head: Normocephalic and atraumatic.  Right Ear: External ear normal.  Left Ear: External ear normal.  Eyes: Conjunctivae and EOM are normal. Pupils are equal, round,  and reactive to light.  Neck: Normal range of motion and full passive range of motion without pain. Neck supple. No neck rigidity.  No rigidity, no meningismus  Cardiovascular: Normal rate, regular rhythm and normal heart sounds.  No murmur heard. Pulmonary/Chest: Effort normal and breath sounds normal. No stridor. No respiratory distress. He has no wheezes. He has no rhonchi.  Abdominal: Soft. Bowel sounds are normal. There is no tenderness. There is no guarding.  Musculoskeletal: Normal range of motion. He exhibits no edema.  Neurological: He is alert and oriented to person, place, and time. He has normal  strength. He displays no tremor. No cranial nerve deficit or sensory deficit. He displays no seizure activity.  AAOx3, answering questions and following commands appropriately; equal strength UE and LE bilaterally; CN grossly intact; moves all extremities appropriately without ataxia; no focal neuro deficits or facial asymmetry appreciated  Skin: Skin is warm and dry. No rash noted. He is not diaphoretic.  Psychiatric: He has a normal mood and affect. His behavior is normal. Thought content normal.  Nursing note and vitals reviewed.    ED Treatments / Results  Labs (all labs ordered are listed, but only abnormal results are displayed) Labs Reviewed  PHENYTOIN LEVEL, TOTAL - Abnormal; Notable for the following components:      Result Value   Phenytoin Lvl 7.8 (*)    All other components within normal limits  CBC WITH DIFFERENTIAL/PLATELET - Abnormal; Notable for the following components:   WBC 10.7 (*)    All other components within normal limits  BASIC METABOLIC PANEL - Abnormal; Notable for the following components:   Glucose, Bld 106 (*)    Creatinine, Ser 0.59 (*)    Calcium 8.8 (*)    All other components within normal limits  CBG MONITORING, ED - Abnormal; Notable for the following components:   Glucose-Capillary 100 (*)    All other components within normal limits  URINALYSIS, ROUTINE W REFLEX MICROSCOPIC    EKG  EKG Interpretation None       Radiology Dg Hip Unilat W Or Wo Pelvis 2-3 Views Right  Result Date: 04/12/2017 CLINICAL DATA:  Right posterior hip pain which is worsening EXAM: DG HIP (WITH OR WITHOUT PELVIS) 2-3V RIGHT COMPARISON:  02/29/2016 FINDINGS: Pelvis view shows advanced degenerative arthropathy of the left hip with complete loss of joint space, acetabular flattening, sclerosis and cyst formation. This is probably worsened somewhat since last year. Right hip shows mild joint space narrowing without bone changes of the acetabulum or femoral head.  Bridging osteophytes are noted at the superior right sacroiliac joint. There are lower lumbar degenerative changes. IMPRESSION: Mild joint space narrowing of the right hip without bony changes. Similar appearance to the study of October 2017. Electronically Signed   By: Paulina FusiMark  Shogry M.D.   On: 04/12/2017 15:48    Procedures Procedures (including critical care time)  Medications Ordered in ED Medications - No data to display   Initial Impression / Assessment and Plan / ED Course  I have reviewed the triage vital signs and the nursing notes.  Pertinent labs & imaging results that were available during my care of the patient were reviewed by me and considered in my medical decision making (see chart for details).  61 year old male here with seizure.  Has history of same.  Denies any recent illness and reports medication compliance.  Patient was postictal with EMS but awake, alert, fully oriented on my evaluation.  He has no focal neurologic deficits.  No significant signs of head trauma.  He is on Dilantin for seizure control.  We will plan for screening labs, Dilantin level.  2:03 AM Notified that patient had seizure here, bit his tongue.  He was given ativan, now currently post-ictal.  Will monitor here.  Dilantin level low, will give IV dose to bring back to therapeutic range.  5:08 AM Patient now more awake/alert-- still somewhat drowsy.  VSS.  6:03 AM Patient now more awake, alert.  He is oriented to baseline at this time.  Vitals are stable.  No further seizure activity.  We will have him continue his home Dilantin--states he only has a few tablets left so will prescribe refill.  Recommended close follow-up with PCP.  Close follow-up with PCP.  Discussed plan with patient, he acknowledged understanding and agreed with plan of care.  Return precautions given for new or worsening symptoms.  Final Clinical Impressions(s) / ED Diagnoses   Final diagnoses:  Seizure [facility])    ED Discharge  Orders        Ordered    phenytoin (DILANTIN) 100 MG ER capsule  Daily at bedtime     04/13/17 0607       Garlon Hatchet, PA-C 04/13/17 0647    Molpus, Jonny Ruiz, MD 04/13/17 [phone]

## 2017-04-15 NOTE — ED Notes (Signed)
Created in error, closing chart

## 2017-04-16 MED FILL — ?PHENYTOIN SOD EXT 100 MG C: 100 | 30 days supply | Qty: 90 | Fill #1

## 2017-04-16 MED FILL — ?ATORVASTATIN 20 MG TABLET: 20 | 30 days supply | Qty: 30 | Fill #1

## 2017-04-17 ENCOUNTER — Ambulatory Visit (INDEPENDENT_AMBULATORY_CARE_PROVIDER_SITE_OTHER): Payer: Self-pay | Admitting: Orthopaedic Surgery

## 2017-04-17 ENCOUNTER — Encounter (INDEPENDENT_AMBULATORY_CARE_PROVIDER_SITE_OTHER): Payer: Self-pay | Admitting: Orthopaedic Surgery

## 2017-04-17 DIAGNOSIS — M1612 Unilateral primary osteoarthritis, left hip: Secondary | ICD-10-CM

## 2017-04-17 DIAGNOSIS — M5416 Radiculopathy, lumbar region: Secondary | ICD-10-CM

## 2017-04-17 DIAGNOSIS — M1611 Unilateral primary osteoarthritis, right hip: Secondary | ICD-10-CM

## 2017-04-17 MED FILL — ?CLONIDINE HCL 0.1 MG TABL: 0.1 | 30 days supply | Qty: 30 | Fill #0

## 2017-04-17 MED FILL — DICLOFENAC SODIUM 1% GEL: 1 | 6 days supply | Qty: 100 | Fill #0

## 2017-04-17 NOTE — Progress Notes (Signed)
Office Visit Note   Patient: Michael Shea           Date of Birth: May 26, 1955           MRN: [id number] Visit Date: 04/17/2017              Requested by: Jaclyn ShaggyAmao, Enobong, MD [address] [place], [place] [address] PCP: Jaclyn ShaggyAmao, Enobong, MD   Assessment & Plan: Visit Diagnoses:  1. Primary osteoarthritis of right hip   2. Primary osteoarthritis of left hip   3. Lumbar radiculopathy     Plan: Impression is right hip pain possible lumbar radiculopathy versus degenerative joint disease.  Patient wishes to undergo physical therapy for strengthening.  We discussed surgical options including total hip replacement which he is not interested.  Out of work for rest of this week.  Today's encounter was performed through an interpreter.  Questions encouraged and answered.  Follow-up as needed. Total face to face encounter time was greater than 45 minutes and over half of this time was spent in counseling and/or coordination of care.  Follow-Up Instructions: Return if symptoms worsen or fail to improve.   Orders:  No orders of the defined types were placed in this encounter.  No orders of the defined types were placed in this encounter.     Procedures: No procedures performed   Clinical Data: No additional findings.   Subjective: Chief Complaint  Patient presents with  . Right Knee - Pain  . Left Knee - Pain    Patient is a 61 year old gentleman who comes in with bilateral hip and buttock pain worse on the right for many years.  He states the pain radiates from his buttock down to his right foot.  He states he has trouble ambulating secondary to the pain.  He denies any significant symptoms in his left hip.  He takes meloxicam as needed.  Denies any recent injuries.  Denies any numbness and tingling.    Review of Systems  Constitutional: Negative.   All other systems reviewed and are negative.    Objective: Vital Signs: There were no vitals taken for this  visit.  Physical Exam  Constitutional: He is oriented to person, place, and time. He appears well-developed and well-nourished.  HENT:  Head: Normocephalic and atraumatic.  Eyes: Pupils are equal, round, and reactive to light.  Neck: Neck supple.  Pulmonary/Chest: Effort normal.  Abdominal: Soft.  Musculoskeletal: Normal range of motion.  Neurological: He is alert and oriented to person, place, and time.  Skin: Skin is warm.  Psychiatric: He has a normal mood and affect. His behavior is normal. Judgment and thought content normal.  Nursing note and vitals reviewed.   Ortho Exam Right hip exam shows good range of motion without significant pain of the right hip.  Negative Stinchfield sign.  Negative straight leg.  Right hip is tender in the posterior lateral aspect. Specialty Comments:  No specialty comments available.  Imaging: No results found.   PMFS History: Patient Active Problem List   Diagnosis Date Noted  . Osteoarthritis 10/09/2016  . Seizures (HCC) 12/16/2015  . Chronic left hip pain 12/16/2015   Past Medical History:  Diagnosis Date  . Arthritis   . Seizures (HCC)   . Sleep walking     Family History  Problem Relation Age of Onset  . Heart failure Mother   . Diabetes Father     History reviewed. No pertinent surgical history. Social History   Occupational History  .  Not on file  Tobacco Use  . Smoking status: Never Smoker  . Smokeless tobacco: Never Used  Substance and Sexual Activity  . Alcohol use: No  . Drug use: No  . Sexual activity: Not on file

## 2017-04-19 ENCOUNTER — Ambulatory Visit: Payer: Self-pay | Attending: Orthopaedic Surgery

## 2017-04-19 DIAGNOSIS — R262 Difficulty in walking, not elsewhere classified: Secondary | ICD-10-CM | POA: Insufficient documentation

## 2017-04-19 DIAGNOSIS — M25551 Pain in right hip: Secondary | ICD-10-CM | POA: Insufficient documentation

## 2017-04-19 DIAGNOSIS — M25652 Stiffness of left hip, not elsewhere classified: Secondary | ICD-10-CM | POA: Insufficient documentation

## 2017-04-19 DIAGNOSIS — M25651 Stiffness of right hip, not elsewhere classified: Secondary | ICD-10-CM | POA: Insufficient documentation

## 2017-04-19 DIAGNOSIS — M25552 Pain in left hip: Secondary | ICD-10-CM | POA: Insufficient documentation

## 2017-04-19 DIAGNOSIS — M6281 Muscle weakness (generalized): Secondary | ICD-10-CM | POA: Insufficient documentation

## 2017-04-19 NOTE — Therapy (Signed)
[facility] [address] [place], [place], [address] Phone: [phone]   Fax:  [phone]  Physical Therapy Evaluation  Patient Details  Name: Johnmatthew Solorio MRN: [id number] Date of Birth: Nov 08, 1955 Referring Provider: Gershon Mussel   Encounter Date: 04/19/2017  PT End of Session - 04/19/17 0752    Visit Number  1    Number of Visits  12    Date for PT Re-Evaluation  06/08/17    Authorization Type  Medicaid    PT Start Time  0830    PT Stop Time  0920    PT Time Calculation (min)  50 min    Activity Tolerance  Patient tolerated treatment well    Behavior During Therapy  [facility] for tasks assessed/performed       Past Medical History:  Diagnosis Date  . Arthritis   . Seizures (HCC)   . Sleep walking     History reviewed. No pertinent surgical history.  There were no vitals filed for this visit.   Subjective Assessment - 04/19/17 0842    Subjective  He reports Rt back and hip pain.       How long can you sit comfortably?  20 min    How long can you stand comfortably?  20 min    How long can you walk comfortably?  1 block.   ( prior to pain 3 blocks)    Diagnostic tests  xrays: OA hips     Patient Stated Goals  He wants to decr pain    Currently in Pain?  Yes    Pain Score  5     Pain Location  Hip and back    Pain Orientation  Right    Pain Type  -- subacute         [facility] PT Assessment - 04/19/17 0001      Assessment   Medical Diagnosis  RT hip pain , ? radiculopathy    Referring Provider  Naiping Xu    Onset Date/Surgical Date  -- 2 months ago.      Next MD Visit  As needed    Prior Therapy  Lt hip PT 1 year ago.       Precautions   Precautions  None      Restrictions   Weight Bearing Restrictions  No      Balance Screen   Has the patient fallen in the past 6 months  No      Prior Function   Level of Independence  Independent    Vocation  Unemployed      Cognition   Overall Cognitive Status   Within Functional Limits for tasks assessed      Posture/Postural Control   Posture Comments  RT shoulder lower than LT , flexed at hips      ROM / Strength   AROM / PROM / Strength  AROM;PROM;Strength      AROM   AROM Assessment Site  Lumbar    Lumbar Flexion  80 end range pain    Lumbar Extension  10    Lumbar - Right Side Bend  15    Lumbar - Left Side Bend  15      PROM   Overall PROM Comments  RT hip flex and ext WFL adduction limited at  10 degrees     PROM Assessment Site  Hip    Right/Left Hip  Right;Left    Right Hip External Rotation   50  Right Hip Internal Rotation   15    Left Hip Extension  0    Left Hip Flexion  80    Left Hip External Rotation   15    Left Hip Internal Rotation   -20    Left Hip ABduction  20    Left Hip ADduction  -20      Strength   Overall Strength Comments  Knee and ankle WNL  LT hip pain ful and limited in ROM,  RT hip WFL but painful       Flexibility   Soft Tissue Assessment /Muscle Length  yes    Hamstrings  LT 35 degrees with pain , RT 70 degrees      Palpation   Palpation comment  Tender soft tissues RT hip > LT       Ambulation/Gait   Gait Comments  no device wide BOS, decr weight LT leg , leans body over RT hip             Objective measurements completed on examination: See above findings.                PT Short Term Goals - 04/19/17 0847      PT SHORT TERM GOAL #1   Title  He will be independent with initial HEP     Time  2    Period  Weeks    Status  New        PT Long Term Goals - 04/19/17 0848      PT LONG TERM GOAL #1   Title  He will be independent with all hEP issued    Time  8    Period  Weeks    Status  New      PT LONG TERM GOAL #2   Title  He will report RT hip /back pain decreased 50% with walking to be able to walk 2 blocks    Time  5    Period  Weeks    Status  New      PT LONG TERM GOAL #3   Title  He will be able to sit for > 30 min without increased pain    Time   5    Period  Weeks    Status  New      PT LONG TERM GOAL #4   Title  He will report pain as intermittant in back and hip    Time  5    Period  Weeks    Status  New             Plan - 04/19/17 0753    Clinical Impression Statement  Mr Marciano SequinBerri reports RT  hip and back pain for 2 months duration. He reports no painRT side prior to this but LT hip pain in past that improved with PT.  He appears to have significant degenerative changes in LT hip and significant pain with movement. Marland Kitchen. He favors LT hip creating abnormal gait and increased load to RT side.  He may neecd to use SPC to decr load to RT side/smooth gait    History and Personal Factors relevant to plan of care:  OA LT hip    Clinical Presentation  Stable    Clinical Decision Making  Low    Rehab Potential  Fair    PT Frequency  1x / week    PT Duration  -- then 2x/week for 4 weeks     PT  Treatment/Interventions  Moist Heat;Ultrasound;Iontophoresis 4mg /ml Dexamethasone;Therapeutic exercise;Therapeutic activities;Manual techniques;Patient/family education;Dry needling;Passive range of motion    PT Next Visit Plan  MAnual to RT hip and back , HEP for stretching, modalities    Consulted and Agree with Plan of Care  Patient       Patient will benefit from skilled therapeutic intervention in order to improve the following deficits and impairments:  Pain, Difficulty walking, Decreased activity tolerance, Decreased strength, Decreased range of motion, Increased muscle spasms, Postural dysfunction  Visit Diagnosis: Pain in right hip  Stiffness of right hip, not elsewhere classified  Difficulty in walking, not elsewhere classified  Stiffness of left hip, not elsewhere classified  Pain in left hip  Muscle weakness (generalized)     Problem List Patient Active Problem List   Diagnosis Date Noted  . Osteoarthritis 10/09/2016  . Seizures (HCC) 12/16/2015  . Chronic left hip pain 12/16/2015    Caprice RedChasse, Chisom Muntean M   PT 04/19/2017, 9:30 AM  [facility] [address] [place], [place], [address] Phone: [phone]   Fax:  [phone]  Name: Dolan Amenlberto Weist MRN: [id number] Date of Birth: August 08, 1955

## 2017-04-20 ENCOUNTER — Encounter: Payer: Self-pay | Admitting: Family Medicine

## 2017-04-20 ENCOUNTER — Ambulatory Visit: Payer: Self-pay | Attending: Family Medicine | Admitting: Family Medicine

## 2017-04-20 ENCOUNTER — Telehealth (INDEPENDENT_AMBULATORY_CARE_PROVIDER_SITE_OTHER): Payer: Self-pay | Admitting: Orthopaedic Surgery

## 2017-04-20 VITALS — BP 160/70 | HR 71 | Temp 97.3°F | Ht 66.0 in | Wt 233.0 lb

## 2017-04-20 DIAGNOSIS — E559 Vitamin D deficiency, unspecified: Secondary | ICD-10-CM | POA: Insufficient documentation

## 2017-04-20 DIAGNOSIS — G4750 Parasomnia, unspecified: Secondary | ICD-10-CM | POA: Insufficient documentation

## 2017-04-20 DIAGNOSIS — M25561 Pain in right knee: Secondary | ICD-10-CM | POA: Insufficient documentation

## 2017-04-20 DIAGNOSIS — M175 Other unilateral secondary osteoarthritis of knee: Secondary | ICD-10-CM

## 2017-04-20 DIAGNOSIS — Z79899 Other long term (current) drug therapy: Secondary | ICD-10-CM | POA: Insufficient documentation

## 2017-04-20 DIAGNOSIS — G8929 Other chronic pain: Secondary | ICD-10-CM | POA: Insufficient documentation

## 2017-04-20 DIAGNOSIS — M1990 Unspecified osteoarthritis, unspecified site: Secondary | ICD-10-CM | POA: Insufficient documentation

## 2017-04-20 DIAGNOSIS — R569 Unspecified convulsions: Secondary | ICD-10-CM | POA: Insufficient documentation

## 2017-04-20 DIAGNOSIS — M25562 Pain in left knee: Secondary | ICD-10-CM | POA: Insufficient documentation

## 2017-04-20 MED ORDER — PHENYTOIN SODIUM EXTENDED 300 MG PO CAPS: 300.0000 mg | ORAL_CAPSULE | Freq: Every day | ORAL | 3 refills | Status: DC

## 2017-04-20 MED ORDER — ERGOCALCIFEROL 1.25 MG (50000 UT) PO CAPS: 50000.0000 [IU] | ORAL_CAPSULE | ORAL | 1 refills | Status: AC

## 2017-04-20 MED ORDER — ATORVASTATIN CALCIUM 20 MG PO TABS: 20.0000 mg | ORAL_TABLET | Freq: Every day | ORAL | 3 refills | Status: AC

## 2017-04-20 MED ORDER — DICLOFENAC SODIUM 1 % TD GEL: 4.0000 g | Freq: Four times a day (QID) | TRANSDERMAL | 2 refills | Status: DC

## 2017-04-20 NOTE — Telephone Encounter (Signed)
Patient called the office stating that he needs an out of work note and also to be on bed rest.  CB#[phone].  Thank you.

## 2017-04-20 NOTE — Progress Notes (Signed)
Subjective:  Patient ID: Michael Shea, male    DOB: Aug 12, 1955  Age: 61 y.o. MRN: [id number]  CC: Seizures   HPI Michael Shea  is a 61 year old male with a history of seizures, osteoarthritis, parasomnia here for a follow-up visit.  Had an ED visit for seizures on 04/12/17 and prior to that on 03/27/17 despite his compliant with his Dilantin.  He received IV fluids and a loading dose of Dilantin after which he was subsequently discharged. He has not had any seizures since then.  He complains of sleep walking and bumping into walls; I had placed him on clonidine for this previously but he states he had to take up to 0.3 mg to help him sleep but then he also states clonidine was discontinued during his ED visit at it was thought to have reduced his efficacy of Dilantin.  He has chronic pain in his knees from osteoarthritis and has run out of his diclofenac gel.  Past Medical History:  Diagnosis Date  . Arthritis   . Seizures (HCC)   . Sleep walking     History reviewed. No pertinent surgical history.  No Known Allergies   Outpatient Medications Prior to Visit  Medication Sig Dispense Refill  . diclofenac sodium (VOLTAREN) 1 % GEL Apply 4 g topically 4 (four) times daily. 100 g 0  . ergocalciferol (DRISDOL) 50000 units capsule Take 1 capsule (50,000 Units total) by mouth once a week. 9 capsule 1  . phenytoin (DILANTIN) 100 MG ER capsule Take 3 capsules (300 mg total) by mouth at bedtime. 90 capsule 0  . phenytoin (DILANTIN) 100 MG ER capsule Take 3 capsules (300 mg total) by mouth at bedtime. 90 capsule 0   No facility-administered medications prior to visit.     ROS Review of Systems  Constitutional: Negative for activity change and appetite change.  HENT: Negative for sinus pressure and sore throat.   Eyes: Negative for visual disturbance.  Respiratory: Negative for cough, chest tightness and shortness of breath.   Cardiovascular: Negative for chest pain and leg  swelling.  Gastrointestinal: Negative for abdominal distention, abdominal pain, constipation and diarrhea.  Endocrine: Negative.   Genitourinary: Negative for dysuria.  Musculoskeletal:       See hpi  Skin: Negative for rash.  Allergic/Immunologic: Negative.   Neurological: Negative for weakness, light-headedness and numbness.  Psychiatric/Behavioral: Negative for dysphoric mood and suicidal ideas.    Objective:  BP (!) 160/70   Pulse 71   Temp (!) 97.3 F (36.3 C) (Oral)   Ht 5\' 6"  (1.676 m)   Wt 233 lb (105.7 kg)   SpO2 97%   BMI 37.61 kg/m   BP/Weight 04/20/2017 04/13/2017 04/12/2017  Systolic BP 160 144 126  Diastolic BP 70 84 70  Wt. (Lbs) 233 - 210  BMI 37.61 - 31.01      Physical Exam  Constitutional: He is oriented to person, place, and time. He appears well-developed and well-nourished.  Cardiovascular: Normal rate, normal heart sounds and intact distal pulses.  No murmur heard. Pulmonary/Chest: Effort normal and breath sounds normal. He has no wheezes. He has no rales. He exhibits no tenderness.  Abdominal: Soft. Bowel sounds are normal. He exhibits no distension and no mass. There is no tenderness.  Musculoskeletal: Normal range of motion. He exhibits tenderness (slight TTP on ROM).  Neurological: He is alert and oriented to person, place, and time.  Skin: Skin is warm and dry.  Psychiatric: He has a normal mood and affect.  Assessment & Plan:   1. Seizures (HCC) Uncontrolled with last seizure 1 week ago. Dilantin level was subtherapeutic at that time but has been therapeutic previously. If seizures persist I will refer him to neurology - phenytoin (DILANTIN) 300 MG ER capsule; Take 1 capsule (300 mg total) by mouth at bedtime.  Dispense: 30 capsule; Refill: 3  2. Other secondary osteoarthritis of right knee Uncontrolled Advised to use knee brace - diclofenac sodium (VOLTAREN) 1 % GEL; Apply 4 g topically 4 (four) times daily.  Dispense: 100 g;  Refill: 2  3. 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 Vitamin D deficiency - ergocalciferol (DRISDOL) 50000 units capsule; Take 1 capsule (50,000 Units total) by mouth once a week.  Dispense: 9 capsule; Refill: 1   Meds ordered this encounter  Medications  . phenytoin (DILANTIN) 300 MG ER capsule    Sig: Take 1 capsule (300 mg total) by mouth at bedtime.    Dispense:  30 capsule    Refill:  3  . diclofenac sodium (VOLTAREN) 1 % GEL    Sig: Apply 4 g topically 4 (four) times daily.    Dispense:  100 g    Refill:  2  . ergocalciferol (DRISDOL) 50000 units capsule    Sig: Take 1 capsule (50,000 Units total) by mouth once a week.    Dispense:  9 capsule    Refill:  1    Follow-up: Return in about 3 months (around 07/19/2017) for follow up of chronic medical conditions.   Jaclyn ShaggyEnobong Amao MD

## 2017-04-20 NOTE — Patient Instructions (Signed)
Seizure, Adult A seizure is a sudden burst of abnormal electrical activity in the brain. The abnormal activity temporarily interrupts normal brain function, causing a person to experience any of the following:  Involuntary movements.  Changes in awareness or consciousness.  Uncontrollable shaking (convulsions).  Seizures usually last from 30 seconds to 2 minutes. They usually do not cause permanent brain damage unless they are prolonged. What can cause a seizure to happen? Seizures can happen for many reasons including:  A fever.  Low blood sugar.  A medicine.  An illnesses.  A brain injury.  Some people who have a seizure never have another one. People who have repeated seizures have a condition called epilepsy. What are the symptoms of a seizure? Symptoms of a seizure vary greatly from person to person. They include:  Convulsions.  Stiffening of the body.  Involuntary movements of the arms or legs.  Loss of consciousness.  Breathing problems.  Falling suddenly.  Confusion.  Head nodding.  Eye blinking or fluttering.  Lip smacking.  Drooling.  Rapid eye movements.  Grunting.  Loss of bladder control and bowel control.  Staring.  Unresponsiveness.  Some people have symptoms right before a seizure happens (aura) and right after a seizure happens. Symptoms of an aura include:  Fear or anxiety.  Nausea.  Feeling like the room is spinning (vertigo).  A feeling of having seen or heard something before (deja vu).  Odd tastes or smells.  Changes in vision, such as seeing flashing lights or spots.  Symptoms that may follow a seizure include:  Confusion.  Sleepiness.  Headache.  Weakness of one side of the body.  Follow these instructions at home: Medicines   Take over-the-counter and prescription medicines only as told by your health care provider.  Avoid any substances that may prevent your medicine from working properly, such as  alcohol. Activity  Do not drive, swim, or do any other activities that would be dangerous if you had another seizure. Wait until your health care provider approves.  If you live in the [place] check with your local DMV (department of motor vehicles) to find out about the local driving laws. Each state has specific rules about when you can legally return to driving.  Get enough rest. Lack of sleep can make seizures more likely to occur. Educating others Teach friends and family what to do if you have a seizure. They should:  Lay you on the ground to prevent a fall.  Cushion your head and body.  Loosen any tight clothing around your neck.  Turn you on your side. If vomiting occurs, this helps keep your airway clear.  Stay with you until you recover.  Not hold you down. Holding you down will not stop the seizure.  Not put anything in your mouth.  Know whether or not you need emergency care.  General instructions  Contact your health care provider each time you have a seizure.  Avoid anything that has ever triggered a seizure for you.  Keep a seizure diary. Record what you remember about each seizure, especially anything that might have triggered the seizure.  Keep all follow-up visits as told by your health care provider. This is important. Contact a health care provider if:  You have another seizure.  You have seizures more often.  Your seizure symptoms change.  You continue to have seizures with treatment.  You have symptoms of an infection or illness. They might increase your risk of having a seizure. Get help   right away if:  You have a seizure: ? That lasts longer than 5 minutes. ? That is different than previous seizures. ? That leaves you unable to speak or use a part of your body. ? That makes it harder to breathe. ? After a head injury.  You have: ? Multiple seizures in a row. ? Confusion or a severe headache right after a seizure.  You are having  seizures more often.  You do not wake up immediately after a seizure.  You injure yourself during a seizure. These symptoms may represent a serious problem that is an emergency. Do not wait to see if the symptoms will go away. Get medical help right away. Call your local emergency services (911 in the [place] Do not drive yourself to the hospital. This information is not intended to replace advice given to you by your health care provider. Make sure you discuss any questions you have with your health care provider. Document Released: 04/14/2000 Document Revised: 12/12/2015 Document Reviewed: 11/19/2015 Elsevier Interactive Patient Education  2018 Elsevier Inc.  

## 2017-04-20 NOTE — Telephone Encounter (Signed)
We discussed yesterday that the patient has a chronic condition and is fully ambulatory.  He does not qualify for either.

## 2017-04-20 NOTE — Telephone Encounter (Signed)
Please review and advise.

## 2017-04-25 ENCOUNTER — Telehealth (INDEPENDENT_AMBULATORY_CARE_PROVIDER_SITE_OTHER): Payer: Self-pay | Admitting: Radiology

## 2017-04-25 NOTE — Telephone Encounter (Signed)
Patient says that since Dr Roda ShuttersXu will not "put him on bedrest", he wants to be referred to another orthopedist. Please call him to discuss/advise.

## 2017-04-26 ENCOUNTER — Ambulatory Visit: Payer: Self-pay | Admitting: Physical Therapy

## 2017-04-26 DIAGNOSIS — M25551 Pain in right hip: Secondary | ICD-10-CM

## 2017-04-26 DIAGNOSIS — M25651 Stiffness of right hip, not elsewhere classified: Secondary | ICD-10-CM

## 2017-04-26 DIAGNOSIS — R262 Difficulty in walking, not elsewhere classified: Secondary | ICD-10-CM

## 2017-04-26 MED FILL — VIT D2 1.25 MG (50,000 UNIT: 1.25 MG | 28 days supply | Qty: 4 | Fill #0

## 2017-04-26 MED FILL — ?CLONIDINE HCL 0.1 MG TABL: 0.1 | 30 days supply | Qty: 30 | Fill #1

## 2017-04-26 MED FILL — DICLOFENAC SODIUM 1% GEL: 1 | 6 days supply | Qty: 100 | Fill #0

## 2017-04-26 NOTE — Therapy (Signed)
[place], [place], [address] Phone: [phone]   Fax:  [phone]  Physical Therapy Treatment  Patient Details  Name: Michael Shea MRN: [id number] Date of Birth: 1956/01/29 Referring Provider: Frankey Shown, MD   Encounter Date: 04/26/2017  PT End of Session - 04/26/17 1021    Visit Number  2    Number of Visits  12    Date for PT Re-Evaluation  06/08/17    Authorization Type  Medicaid    PT Start Time  9622    PT Stop Time  1114 MHP last 12 min     PT Time Calculation (min)  59 min    Activity Tolerance  Patient tolerated treatment well    Behavior During Therapy  [facility] for tasks assessed/performed       Past Medical History:  Diagnosis Date  . Arthritis   . Seizures ([place])   . Sleep walking     History reviewed. No pertinent surgical history.  There were no vitals filed for this visit.  Subjective Assessment - 04/26/17 1018    Subjective  Pt reports he feels the same as when he came last week.  He is concerned about coverage for medical bills.     Currently in Pain?  Yes    Pain Score  7     Pain Location  Hip    Pain Orientation  Right    Pain Descriptors / Indicators  Aching;Dull    Aggravating Factors   stairs, walking     Pain Relieving Factors  ??         [facility] PT Assessment - 04/26/17 0001      Assessment   Medical Diagnosis  Rt hip pain     Referring Provider  Frankey Shown, MD    Next MD Visit  PRN        [facility] Adult PT Treatment/Exercise - 04/26/17 0001      Self-Care   Self-Care  Other Self-Care Comments    Other Self-Care Comments   Pt educated on self massage with tennis ball to low back and hips to decrease fascial tightness/tenderness; pt returned demo and verbalized understanding.       Knee/Hip Exercises: Stretches   Passive Hamstring Stretch  Right;Left;4 reps;30 seconds 2 reps in sitting; 2 reps in supine    Quad Stretch  Right;Left;2 reps;30 seconds    Piriformis Stretch  Right;Left;2 reps;30 seconds supine, fig 4. multiple cues for form    Gastroc Stretch  Right;Left;2 reps;20 seconds runners stretch      Knee/Hip Exercises: Aerobic   Nustep  L4: 5 min  VC to continue pedaling when talking      Knee/Hip Exercises: Seated   Sit to Sand  1 set;10 reps;without UE support eccentric control, VC to shift Lt      Knee/Hip Exercises: Supine   Bridges  2 sets;10 reps      Knee/Hip Exercises: Sidelying   Clams  each side, 1 set of 10      Modalities   Modalities  Moist Heat      Moist Heat Therapy   Number Minutes Moist Heat  12 Minutes    Moist Heat Location  Hip Rt             PT Education - 04/26/17 1223    Education provided  Yes    Education Details  HEP - included sit to/from stand, clam, bridge, hamstring/calf/piriformis stretches    Person(s)  Educated  Patient    Methods  Explanation;Demonstration;Tactile cues;Verbal cues;Handout    Comprehension  Verbalized understanding;Returned demonstration       PT Short Term Goals - 04/26/17 1023      PT SHORT TERM GOAL #1   Title  He will be independent with initial HEP     Time  2    Period  Weeks    Status  On-going        PT Long Term Goals - 04/26/17 1023      PT LONG TERM GOAL #1   Title  He will be independent with all HEP issued    Time  8    Period  Weeks    Status  On-going      PT LONG TERM GOAL #2   Title  He will report Rt hip /back pain decreased 50% with walking to be able to walk 2 blocks    Time  5    Period  Weeks    Status  On-going      PT LONG TERM GOAL #3   Title  He will be able to sit for > 30 min without increased pain    Time  5    Period  Weeks    Status  On-going      PT LONG TERM GOAL #4   Title  He will report pain as intermittant in back and hip    Time  5    Period  Weeks    Status  On-going            Plan - 04/26/17 1103    Clinical Impression Statement  Michael Shea required minor tactile/VC for improved form of  exercise.  He reported reduction of pain to 2-3/10 after completing exercises.  Further reduction after MHP at end of session.  No goals met yet; only second visit.     Rehab Potential  Fair    PT Frequency  1x / week    PT Duration  -- then 2x/wk for 4 wks per eval    PT Treatment/Interventions  Moist Heat;Ultrasound;Iontophoresis '4mg'$ /ml Dexamethasone;Therapeutic exercise;Therapeutic activities;Manual techniques;Patient/family education;Dry needling;Passive range of motion    PT Next Visit Plan  manual therapy to hips/back; review HEP and progress as tolerated.     Consulted and Agree with Plan of Care  Patient       Patient will benefit from skilled therapeutic intervention in order to improve the following deficits and impairments:  Pain, Difficulty walking, Decreased activity tolerance, Decreased strength, Decreased range of motion, Increased muscle spasms, Postural dysfunction  Visit Diagnosis: Pain in right hip  Stiffness of right hip, not elsewhere classified  Difficulty in walking, not elsewhere classified     Problem List Patient Active Problem List   Diagnosis Date Noted  . Vitamin D deficiency 04/20/2017  . Osteoarthritis 10/09/2016  . Seizures ([place]) 12/16/2015  . Chronic left hip pain 12/16/2015   Kerin Perna, PTA 04/26/17 12:29 PM  [facility] [address] [place], [place], [address] Phone: [phone]   Fax:  [phone]  Name: Michael Shea MRN: [id number] Date of Birth: 01/25/1956

## 2017-04-26 NOTE — Telephone Encounter (Signed)
Sure, blackman

## 2017-04-26 NOTE — Telephone Encounter (Signed)
I called and left voicemail and advised patient could call back and schedule himself an appointment with Dr. Magnus IvanBlackman next year if he wishes.

## 2017-04-30 ENCOUNTER — Telehealth: Payer: Self-pay | Admitting: Physical Therapy

## 2017-04-30 ENCOUNTER — Ambulatory Visit: Payer: Self-pay | Admitting: Physical Therapy

## 2017-04-30 NOTE — Telephone Encounter (Signed)
Left voicemail about missed visit today.  Informed him of next appointment date and time.  I asked him to call if he cannot attend or if he is no longer interested in PT.  I asked him to refer to the attendance policy for missed visits.  Liz BeachKaren Javarious Elsayed PTA

## 2017-05-03 ENCOUNTER — Telehealth: Payer: Self-pay | Admitting: Physical Therapy

## 2017-05-03 ENCOUNTER — Ambulatory Visit: Payer: Medicaid Other | Attending: Orthopaedic Surgery | Admitting: Physical Therapy

## 2017-05-03 DIAGNOSIS — M25552 Pain in left hip: Secondary | ICD-10-CM | POA: Insufficient documentation

## 2017-05-03 DIAGNOSIS — M25651 Stiffness of right hip, not elsewhere classified: Secondary | ICD-10-CM | POA: Insufficient documentation

## 2017-05-03 DIAGNOSIS — R262 Difficulty in walking, not elsewhere classified: Secondary | ICD-10-CM | POA: Insufficient documentation

## 2017-05-03 DIAGNOSIS — M6281 Muscle weakness (generalized): Secondary | ICD-10-CM | POA: Insufficient documentation

## 2017-05-03 DIAGNOSIS — M25551 Pain in right hip: Secondary | ICD-10-CM | POA: Insufficient documentation

## 2017-05-03 DIAGNOSIS — M25652 Stiffness of left hip, not elsewhere classified: Secondary | ICD-10-CM | POA: Insufficient documentation

## 2017-05-03 NOTE — Telephone Encounter (Signed)
Left message on voicemail about attendance policy for missing 2 visits.  Informed him all but the next appointment will be cancelled and he can schedule one at a time.  I informed him of the next appointment date and time.  I asked him to call to cancel if he no longer needs PT or is unable to attend.  Liz BeachKaren Harris PTA

## 2017-05-04 ENCOUNTER — Ambulatory Visit: Payer: Self-pay | Attending: Family Medicine

## 2017-05-04 ENCOUNTER — Encounter: Payer: Self-pay | Admitting: Physical Therapy

## 2017-05-04 ENCOUNTER — Ambulatory Visit: Payer: Medicaid Other | Admitting: Physical Therapy

## 2017-05-04 DIAGNOSIS — M25652 Stiffness of left hip, not elsewhere classified: Secondary | ICD-10-CM

## 2017-05-04 DIAGNOSIS — M25651 Stiffness of right hip, not elsewhere classified: Secondary | ICD-10-CM

## 2017-05-04 DIAGNOSIS — M25551 Pain in right hip: Secondary | ICD-10-CM

## 2017-05-04 DIAGNOSIS — M25552 Pain in left hip: Secondary | ICD-10-CM

## 2017-05-04 DIAGNOSIS — R262 Difficulty in walking, not elsewhere classified: Secondary | ICD-10-CM

## 2017-05-04 DIAGNOSIS — M6281 Muscle weakness (generalized): Secondary | ICD-10-CM

## 2017-05-04 NOTE — Patient Instructions (Signed)
Hip Flexor Stretch    Lying on back near edge of bed, bend one leg, foot flat. Hang other leg over edge, relaxed, thigh resting entirely on bed for __1__ minutes. Repeat _1___ times. Do __2__ sessions per day. Advanced Exercise: Bend knee back keeping thigh in contact with bed.  http://gt2.exer.us/346   Copyright  VHI. All rights reserved.  Butterfly, Supine    Lie on back, feet together. Lower knees toward floor. Hold _30__ seconds. Repeat _3__ times per session. Do _2__ sessions per day.  Copyright  VHI. All rights reserved.

## 2017-05-04 NOTE — Therapy (Signed)
[facility] [address] [place], [place], [address] Phone: [phone]   Fax:  [phone]  Physical Therapy Treatment  Patient Details  Name: Michael Shea MRN: [id number] Date of Birth: [birth year] Referring Provider: Gershon Mussel, MD   Encounter Date: 05/04/2017  PT End of Session - 05/04/17 1132    Visit Number  3    Number of Visits  12    Date for PT Re-Evaluation  06/08/17    Authorization Type  [employer] - need new dates ( per patient through 11/09/2017 )     PT Start Time  1112    PT Stop Time  1210    PT Time Calculation (min)  58 min       Past Medical History:  Diagnosis Date  . Arthritis   . Seizures ([facility])   . Sleep walking     History reviewed. No pertinent surgical history.  There were no vitals filed for this visit.  Subjective Assessment - 05/04/17 1131    Subjective  I cannot climb the stairs.     Currently in Pain?  Yes    Pain Score  7     Pain Location  Hip    Pain Orientation  Right    Pain Descriptors / Indicators  Aching    Aggravating Factors   stairs     Pain Relieving Factors  prescription gel                       [facility] Adult PT Treatment/Exercise - 05/04/17 0001      Knee/Hip Exercises: Stretches   Quad Stretch  3 reps;30 seconds hip flexor with knee flexion edge of mat 4 inch step     Hip Flexor Stretch  3 reps;20 seconds lunge at counter for hip flexor and also gastroc    Other Knee/Hip Stretches  butterfly stretch       Knee/Hip Exercises: Aerobic   Nustep  L4 x 5 minutes LE only       Knee/Hip Exercises: Standing   Functional Squat  10 reps    Other Standing Knee Exercises  hip abduction and extension 10 x 2 each , standing hip f      Knee/Hip Exercises: Supine   Bridges  2 sets;10 reps    Bridges with [employer]  10 reps    Bridges with Clamshell  10 reps      Knee/Hip Exercises: Sidelying   Clams  20 red      Moist Heat Therapy   Number Minutes Moist Heat   15 Minutes    Moist Heat Location  Hip groin      Manual Therapy   Manual therapy comments  passive supine hip abduction             PT Education - 05/04/17 1204    Education provided  Yes    Education Details  HEP    Person(s) Educated  Patient    Methods  Explanation;Handout    Comprehension  Verbalized understanding       PT Short Term Goals - 04/26/17 1023      PT SHORT TERM GOAL #1   Title  He will be independent with initial HEP     Time  2    Period  Weeks    Status  On-going        PT Long Term Goals - 04/26/17 1023      PT LONG TERM GOAL #  1   Title  He will be independent with all HEP issued    Time  8    Period  Weeks    Status  On-going      PT LONG TERM GOAL #2   Title  He will report Rt hip /back pain decreased 50% with walking to be able to walk 2 blocks    Time  5    Period  Weeks    Status  On-going      PT LONG TERM GOAL #3   Title  He will be able to sit for > 30 min without increased pain    Time  5    Period  Weeks    Status  On-going      PT LONG TERM GOAL #4   Title  He will report pain as intermittant in back and hip    Time  5    Period  Weeks    Status  On-going            Plan - 05/04/17 1205    Clinical Impression Statement  Pt arrives 12 minutes late. He reports overall his pain is about the same since evaluation. He reports HEP compliance 1 x per day and notes improved mobility with hamstring stretch. Instructed him in various hip mobility exercises without increased pain. Mild pain reported with passive hip abduction. Spent time discussing attendance as he has no-showed to last two appointments. He agrees to call if he cannot attend in the future. Good stretch felt with hip flexor and butterfly stretch so added to HEP and encouraged increased frequency. HMP at end of session. He reports feeling a little better at end of session.     PT Next Visit Plan  manual therapy to hips/back; review HEP and progress as tolerated.      PT Home Exercise Plan  Sit-stand, bridge, calf stretch, piriformis stretch, hamstring stretch, butterfly, hip flexor supine stretch    Consulted and Agree with Plan of Care  Patient       Patient will benefit from skilled therapeutic intervention in order to improve the following deficits and impairments:  Pain, Difficulty walking, Decreased activity tolerance, Decreased strength, Decreased range of motion, Increased muscle spasms, Postural dysfunction  Visit Diagnosis: Pain in right hip  Stiffness of right hip, not elsewhere classified  Difficulty in walking, not elsewhere classified  Stiffness of left hip, not elsewhere classified  Pain in left hip  Muscle weakness (generalized)     Problem List Patient Active Problem List   Diagnosis Date Noted  . Vitamin D deficiency 04/20/2017  . Osteoarthritis 10/09/2016  . Seizures ([facility]) 12/16/2015  . Chronic left hip pain 12/16/2015    Sherrie Mustacheonoho, Ira Dougher McGee, PTA 05/04/2017, 12:14 PM  [facility] [address] [place], [place], [address] Phone: [phone]   Fax:  [phone]  Name: Michael Shea MRN: [id number] Date of Birth: Apr 25, 1956

## 2017-05-07 ENCOUNTER — Encounter: Payer: Self-pay | Admitting: Physical Therapy

## 2017-05-07 ENCOUNTER — Ambulatory Visit: Payer: Medicaid Other | Admitting: Physical Therapy

## 2017-05-07 DIAGNOSIS — R262 Difficulty in walking, not elsewhere classified: Secondary | ICD-10-CM

## 2017-05-07 DIAGNOSIS — M6281 Muscle weakness (generalized): Secondary | ICD-10-CM

## 2017-05-07 DIAGNOSIS — M25552 Pain in left hip: Secondary | ICD-10-CM

## 2017-05-07 DIAGNOSIS — M25651 Stiffness of right hip, not elsewhere classified: Secondary | ICD-10-CM

## 2017-05-07 DIAGNOSIS — M25551 Pain in right hip: Secondary | ICD-10-CM

## 2017-05-07 DIAGNOSIS — M25652 Stiffness of left hip, not elsewhere classified: Secondary | ICD-10-CM

## 2017-05-07 NOTE — Therapy (Signed)
[facility]patient [facility] [address] [place], [place], [address] Phone: [phone]   Fax:  [phone]  Physical Therapy Treatment  Patient Details  Name: Armend Hochstatter MRN: [id number] Date of Birth: 06-17-1955 Referring Provider: Gershon Mussel, MD   Encounter Date: 05/07/2017  PT End of Session - 05/07/17 1258    Visit Number  4    Number of Visits  12    Date for PT Re-Evaluation  06/08/17    PT Start Time  1147    PT Stop Time  1228    PT Time Calculation (min)  41 min    Activity Tolerance  Patient tolerated treatment well    Behavior During Therapy  [facility] for tasks assessed/performed       Past Medical History:  Diagnosis Date  . Arthritis   . Seizures ([facility])   . Sleep walking     History reviewed. No pertinent surgical history.  There were no vitals filed for this visit.  Subjective Assessment - 05/07/17 1251    Subjective  Motion is improving,  pain is less. I have been doing the exercises.    Currently in Pain?  Yes    Pain Location  Hip    Pain Orientation  Right;Left    Pain Descriptors / Indicators  Aching;Tightness    Aggravating Factors   stairs    Pain Relieving Factors  prescription gel                      [facility] Adult PT Treatment/Exercise - 05/07/17 0001      Knee/Hip Exercises: Aerobic   Nustep  L4 x 5 minutes LE only       Knee/Hip Exercises: Standing   Gait Training  cued for technique  able to decrease limp.    Other Standing Knee Exercises  wall slides facing wall 10 X each  cued,       Knee/Hip Exercises: Prone   Hip Extension  1 set;10 reps;Both Difficult      Modalities   Modalities  Moist Heat      Moist Heat Therapy   Moist Heat Location  Hip during some stretching      Manual Therapy   Manual therapy comments  Passive Hip stretch,  Flexion, abduction IR/ER, piriformis,   quads,  Hip flexor stretches  hamstrings..  IR most limited  LT>  RT             PT Education -  05/07/17 1258    Education Details  HEP  extra copy issued from last visit    Person(s) Educated  Patient    Methods  Explanation;Handout    Comprehension  Verbalized understanding;Returned demonstration       PT Short Term Goals - 05/07/17 1304      PT SHORT TERM GOAL #1   Title  He will be independent with initial HEP     Baseline  Neded mor handouts today  of previous issued ex    Time  2    Period  Weeks    Status  On-going        PT Long Term Goals - 04/26/17 1023      PT LONG TERM GOAL #1   Title  He will be independent with all HEP issued    Time  8    Period  Weeks    Status  On-going      PT LONG TERM GOAL #2   Title  He will report Rt hip /back pain decreased 50% with walking to be able to walk 2 blocks    Time  5    Period  Weeks    Status  On-going      PT LONG TERM GOAL #3   Title  He will be able to sit for > 30 min without increased pain    Time  5    Period  Weeks    Status  On-going      PT LONG TERM GOAL #4   Title  He will report pain as intermittant in back and hip    Time  5    Period  Weeks    Status  On-going            Plan - 05/07/17 1300    Clinical Impression Statement  Patient early today.  Stretching focus  all planes  hamstrings WNL Both,  IR ROM most limited,  Less antalgic gait noted after wall slides,  pre gait weightshifting.  No pain at end of session    PT Next Visit Plan  manual therapy to hips/back; review HEP and progress as tolerated.     PT Home Exercise Plan  Sit-stand, bridge, calf stretch, piriformis stretch, hamstring stretch, butterfly, hip flexor supine stretch    Consulted and Agree with Plan of Care  Patient       Patient will benefit from skilled therapeutic intervention in order to improve the following deficits and impairments:     Visit Diagnosis: Pain in right hip  Stiffness of right hip, not elsewhere classified  Difficulty in walking, not elsewhere classified  Stiffness of left hip, not  elsewhere classified  Pain in left hip  Muscle weakness (generalized)     Problem List Patient Active Problem List   Diagnosis Date Noted  . Vitamin D deficiency 04/20/2017  . Osteoarthritis 10/09/2016  . Seizures ([facility]) 12/16/2015  . Chronic left hip pain 12/16/2015    HARRIS,KAREN PTA 05/07/2017, 1:05 PM  [facility] [address] [place], [place], [address] Phone: [phone]   Fax:  [phone]  Name: Dolan Amenlberto Briseno MRN: [id number] Date of Birth: [birth year]

## 2017-05-09 ENCOUNTER — Encounter: Payer: Medicaid Other | Admitting: Physical Therapy

## 2017-05-09 NOTE — Congregational Nurse Program (Signed)
Congregational Nurse Program Note  Date of Encounter: 04/10/2017  Past Medical History: Past Medical History:  Diagnosis Date  . Arthritis   . Seizures ([facility])   . Sleep walking     Encounter Details: CNP Questionnaire - 04/10/17 1000      Questionnaire   Patient Status  Not Applicable    Race  Hispanic or Latino    Location Patient Served At  [facility]    Insurance  Not Applicable    Uninsured  Uninsured (NEW 1x/quarter)    Food  Yes, have food insecurities    Housing/Utilities  No permanent housing    Transportation  Yes, need transportation assistance    Interpersonal Safety  Yes, feel physically and emotionally safe where you currently live    Medication  No medication insecurities    Medical Provider  Yes    Referrals  Primary Care Provider/Clinic;Other    ED Visit Averted  Not Applicable    Life-Saving Intervention Made  Not Applicable     Client states he wants to talk to CN regarding seizures. States he is out of medication but [facility]. CN to support transportation needs if client requests. Foolow up in CN clinic as needed.

## 2017-05-11 ENCOUNTER — Encounter: Payer: Self-pay | Admitting: Pediatric Intensive Care

## 2017-05-14 ENCOUNTER — Ambulatory Visit: Payer: Self-pay | Attending: Family Medicine

## 2017-05-15 ENCOUNTER — Ambulatory Visit: Payer: Medicaid Other | Admitting: Physical Therapy

## 2017-05-15 ENCOUNTER — Encounter: Payer: Self-pay | Admitting: Physical Therapy

## 2017-05-15 DIAGNOSIS — R262 Difficulty in walking, not elsewhere classified: Secondary | ICD-10-CM

## 2017-05-15 DIAGNOSIS — M25651 Stiffness of right hip, not elsewhere classified: Secondary | ICD-10-CM

## 2017-05-15 DIAGNOSIS — M25551 Pain in right hip: Secondary | ICD-10-CM

## 2017-05-15 NOTE — Therapy (Signed)
[place], [place], [address] Phone: [phone]   Fax:  [phone]  Physical Therapy Treatment  Patient Details  Name: Michael Shea MRN: [id number] Date of Birth: 1955/08/22 Referring Provider: Frankey Shown, MD   Encounter Date: 05/15/2017  PT End of Session - 05/15/17 1651    Visit Number  5    Number of Visits  12    Date for PT Re-Evaluation  06/08/17    PT Start Time  1520 short session today due to patient late 19 minutes    PT Stop Time  1555    PT Time Calculation (min)  35 min    Activity Tolerance  Patient tolerated treatment well    Behavior During Therapy  [facility] for tasks assessed/performed       Past Medical History:  Diagnosis Date  . Arthritis   . Seizures ([place])   . Sleep walking     History reviewed. No pertinent surgical history.  There were no vitals filed for this visit.  Subjective Assessment - 05/15/17 1523    Subjective  Pain up to 9/01.  i need to do the ex more at home.  The only way to stop pain is to sit down.  I can walk a block or 2 with 9/10 pain.  I have to sit then get up and walk a little more.     Currently in Pain?  Yes    Pain Score  0-No pain increases to 9/10    Pain Location  Hip    Pain Orientation  Left;Right    Pain Descriptors / Indicators  Aching;Tightness;Shooting    Pain Radiating Towards  both thighs    Pain Frequency  Intermittent    Aggravating Factors   walking,  lifting leg, stairs    Pain Relieving Factors  sitting,  prescription gel    Effect of Pain on Daily Activities  limits walking,  ADLs    Multiple Pain Sites  No         [facility] PT Assessment - 05/15/17 0001      Strength   Overall Strength Comments  hip extension 3/5 both prone  within available range.                  [facility] Adult PT Treatment/Exercise - 05/15/17 0001      Knee/Hip Exercises: Stretches   Hip Flexor Stretch  1 rep;10 seconds both prone      Knee/Hip  Exercises: Supine   Quad Sets  1 set;10 reps Also gluteal sets with arms overhead 10 x no pillow    Bridges  1 set;10 reps    Other Supine Knee/Hip Exercises  bent knee march with abdominal bracing      Knee/Hip Exercises: Prone   Hip Extension  -- 3 reps each ROM limited    Other Prone Exercises  hip flexor stretch prone press ups patient noted decreased with these.       Modalities   Modalities  Moist Heat      Moist Heat Therapy   Number Minutes Moist Heat  10 Minutes    Moist Heat Location  Hip groin             PT Education - 05/15/17 1643    Education provided  Yes    Education Details  HEP    Person(s) Educated  Patient    Methods  Demonstration;Handout;Tactile cues    Comprehension  Verbalized understanding;Returned demonstration  PT Short Term Goals - 05/15/17 1658      PT SHORT TERM GOAL #1   Title  He will be independent with initial HEP     Baseline  not consistent.  needs cues    Time  2    Period  Weeks    Status  On-going        PT Long Term Goals - 04/26/17 1023      PT LONG TERM GOAL #1   Title  He will be independent with all HEP issued    Time  8    Period  Weeks    Status  On-going      PT LONG TERM GOAL #2   Title  He will report Rt hip /back pain decreased 50% with walking to be able to walk 2 blocks    Time  5    Period  Weeks    Status  On-going      PT LONG TERM GOAL #3   Title  He will be able to sit for > 30 min without increased pain    Time  5    Period  Weeks    Status  On-going      PT LONG TERM GOAL #4   Title  He will report pain as intermittant in back and hip    Time  5    Period  Weeks    Status  On-going            Plan - 05/15/17 1653    Clinical Impression Statement  Patient arrived late today so short session, he was driving in from out of town.  Pain continues to be severe with walking 1-2 blocks.   Some of the pain he has today may be back related.  He admits he could do more with his home ex  program.  Pain  with walking has not yet improved. No new goals met.    PT Next Visit Plan  manual therapy to hips/back; review HEP and progress as tolerated.     PT Home Exercise Plan  Sit-stand, bridge, calf stretch, piriformis stretch, hamstring stretch, butterfly, hip flexor supine stretch,  clam with band    Consulted and Agree with Plan of Care  Patient       Patient will benefit from skilled therapeutic intervention in order to improve the following deficits and impairments:     Visit Diagnosis: Pain in right hip  Stiffness of right hip, not elsewhere classified  Difficulty in walking, not elsewhere classified     Problem List Patient Active Problem List   Diagnosis Date Noted  . Vitamin D deficiency 04/20/2017  . Osteoarthritis 10/09/2016  . Seizures ([place]) 12/16/2015  . Chronic left hip pain 12/16/2015    Carrieann Spielberg  PTA 05/15/2017, 5:01 PM  [facility] [address] [place], [place], [address] Phone: [phone]   Fax:  [phone]  Name: Michael Shea MRN: [id number] Date of Birth: [birth year]

## 2017-05-15 NOTE — Patient Instructions (Signed)
Abduction: Clam (Eccentric) - Side-Lying    Lie on side with knees bent. Lift top knee, keeping feet together. Keep trunk steady. Slowly lower for 3-5 seconds. __10_ reps per set, _1 to 3__ sets per day,    Use blue band AROUND THIGHS  http://ecce.exer.us/64   Copyright  VHI. All rights reserved.

## 2017-05-16 ENCOUNTER — Encounter: Payer: Medicaid Other | Admitting: Physical Therapy

## 2017-05-18 MED FILL — PHENYTOIN SOD EXT 300 MG CA: 300 | 30 days supply | Qty: 30 | Fill #0

## 2017-05-18 MED FILL — DICLOFENAC SODIUM 1% GEL: 1 | 6 days supply | Qty: 100 | Fill #1

## 2017-05-18 MED FILL — VIT D2 1.25 MG (50,000 UNIT: 1.25 MG | 28 days supply | Qty: 4 | Fill #1

## 2017-05-21 ENCOUNTER — Encounter: Payer: Medicaid Other | Admitting: Physical Therapy

## 2017-05-23 ENCOUNTER — Encounter: Payer: Medicaid Other | Admitting: Physical Therapy

## 2017-05-24 ENCOUNTER — Ambulatory Visit: Payer: Medicaid Other | Admitting: Physical Therapy

## 2017-05-24 DIAGNOSIS — M25552 Pain in left hip: Secondary | ICD-10-CM

## 2017-05-24 DIAGNOSIS — R262 Difficulty in walking, not elsewhere classified: Secondary | ICD-10-CM

## 2017-05-24 DIAGNOSIS — M6281 Muscle weakness (generalized): Secondary | ICD-10-CM

## 2017-05-24 DIAGNOSIS — M25652 Stiffness of left hip, not elsewhere classified: Secondary | ICD-10-CM

## 2017-05-24 DIAGNOSIS — M25651 Stiffness of right hip, not elsewhere classified: Secondary | ICD-10-CM

## 2017-05-24 DIAGNOSIS — M25551 Pain in right hip: Secondary | ICD-10-CM

## 2017-05-24 NOTE — Patient Instructions (Signed)
Extensors / Rotators, Supine    USE A TOWEL AROUND YOUR LEG TO HELP PULL THE LEG TOWARDS YOUR OPPOSITE SHOULDER. Lie supine, one leg straight, other leg bent, knee held by opposite hand. Gently pull knee toward opposite shoulder. Feel stretch in buttocks and outside of hip. Hold __30_ seconds. Repeat _3__ times per session. Do _2-3__ sessions per day.  Copyright  VHI. All rights reserved.

## 2017-05-24 NOTE — Therapy (Signed)
[facility] [address] [place], [place], [address] Phone: [phone]   Fax:  [phone]  Physical Therapy Treatment  Patient Details  Name: Michael Shea MRN: [id number] Date of Birth: 1955-09-24 Referring Provider: Gershon Mussel, MD   Encounter Date: 05/24/2017  PT End of Session - 05/24/17 1518    Visit Number  6    Number of Visits  12    Date for PT Re-Evaluation  06/08/17    Authorization Type  CAFA - need new dates ( per patient through 11/09/2017 )     PT Start Time  1435    PT Stop Time  1525    PT Time Calculation (min)  50 min    Activity Tolerance  Patient tolerated treatment well    Behavior During Therapy  [facility] for tasks assessed/performed       Past Medical History:  Diagnosis Date  . Arthritis   . Seizures ([facility])   . Sleep walking     History reviewed. No pertinent surgical history.  There were no vitals filed for this visit.  Subjective Assessment - 05/24/17 1439    Subjective  reports he's getting a little better.  doing exercises at home.    Patient Stated Goals  He wants to decr pain    Currently in Pain?  Yes    Pain Score  6     Pain Location  Hip    Pain Orientation  Left    Pain Descriptors / Indicators  Dull    Pain Type  Chronic pain    Aggravating Factors   walking, lifting leg, stairs    Pain Relieving Factors  sitting, Rx gel                      [facility] Adult PT Treatment/Exercise - 05/24/17 1441      Knee/Hip Exercises: Stretches   Hip Flexor Stretch  Left;3 reps;30 seconds    Piriformis Stretch  3 reps;30 seconds;Left knee to opposite shoulder with towel; mod cues needed      Knee/Hip Exercises: Aerobic   Nustep  L5 x 6 min      Knee/Hip Exercises: Supine   Bridges  1 set;10 reps with ball squeeze      Knee/Hip Exercises: Sidelying   Clams  2x15; blue theraband on 2nd set bil      Modalities   Modalities  Moist Heat      Moist Heat Therapy   Number Minutes  Moist Heat  10 Minutes    Moist Heat Location  Hip groin      Manual Therapy   Manual therapy comments  Lt hip: long axis distraction; and Lt hip passive stretching including internal rotation, flexion and adduction             PT Education - 05/24/17 1517    Education provided  Yes    Education Details  piriformis stretch    Person(s) Educated  Patient    Methods  Explanation;Demonstration;Handout    Comprehension  Verbalized understanding;Returned demonstration;Need further instruction       PT Short Term Goals - 05/24/17 1518      PT SHORT TERM GOAL #1   Title  He will be independent with initial HEP     Baseline  05/24/17: not consistent.  needs cues    Status  On-going        PT Long Term Goals - 04/26/17 1023  PT LONG TERM GOAL #1   Title  He will be independent with all HEP issued    Time  8    Period  Weeks    Status  On-going      PT LONG TERM GOAL #2   Title  He will report Rt hip /back pain decreased 50% with walking to be able to walk 2 blocks    Time  5    Period  Weeks    Status  On-going      PT LONG TERM GOAL #3   Title  He will be able to sit for > 30 min without increased pain    Time  5    Period  Weeks    Status  On-going      PT LONG TERM GOAL #4   Title  He will report pain as intermittant in back and hip    Time  5    Period  Weeks    Status  On-going            Plan - 05/24/17 1518    Clinical Impression Statement  Pt tolerated session well today and frequently throughout session requesting manual therapy stating "that really helps, I can feel it working."  Advised pt that while manual therapy is beneficial, one goal for PT is to be able to do stretches independently.  Provided piriformis stretch today with towels and needed constant cues for how to properly perform at home.  Will continue to benefit from PT to maximize function.  Pt still inconsistent with HEP at this time.    PT Treatment/Interventions  Moist  Heat;Ultrasound;Iontophoresis 4mg /ml Dexamethasone;Therapeutic exercise;Therapeutic activities;Manual techniques;Patient/family education;Dry needling;Passive range of motion    PT Next Visit Plan  manual therapy to hips/back; review HEP and progress as tolerated.     PT Home Exercise Plan  Sit-stand, bridge, calf stretch, piriformis stretch, hamstring stretch, butterfly, hip flexor supine stretch,  clam with band    Consulted and Agree with Plan of Care  Patient       Patient will benefit from skilled therapeutic intervention in order to improve the following deficits and impairments:  Pain, Difficulty walking, Decreased activity tolerance, Decreased strength, Decreased range of motion, Increased muscle spasms, Postural dysfunction  Visit Diagnosis: Pain in right hip  Stiffness of right hip, not elsewhere classified  Difficulty in walking, not elsewhere classified  Stiffness of left hip, not elsewhere classified  Pain in left hip  Muscle weakness (generalized)     Problem List Patient Active Problem List   Diagnosis Date Noted  . Vitamin D deficiency 04/20/2017  . Osteoarthritis 10/09/2016  . Seizures ([facility]) 12/16/2015  . Chronic left hip pain 12/16/2015      Clarita CraneStephanie F Yehonatan Grandison, PT, DPT 05/24/17 3:22 PM    [facility] [address] [place], [place], [address] Phone: [phone]   Fax:  [phone]  Name: Michael Shea MRN: [id number] Date of Birth: Feb 03, 1956

## 2017-05-26 ENCOUNTER — Encounter: Payer: Self-pay | Admitting: Physician Assistant

## 2017-05-30 ENCOUNTER — Encounter: Payer: Self-pay | Admitting: Physical Therapy

## 2017-05-30 ENCOUNTER — Ambulatory Visit: Payer: Medicaid Other | Admitting: Physical Therapy

## 2017-05-30 DIAGNOSIS — M25651 Stiffness of right hip, not elsewhere classified: Secondary | ICD-10-CM

## 2017-05-30 DIAGNOSIS — M6281 Muscle weakness (generalized): Secondary | ICD-10-CM

## 2017-05-30 DIAGNOSIS — M25551 Pain in right hip: Secondary | ICD-10-CM

## 2017-05-30 DIAGNOSIS — M25652 Stiffness of left hip, not elsewhere classified: Secondary | ICD-10-CM

## 2017-05-30 DIAGNOSIS — R262 Difficulty in walking, not elsewhere classified: Secondary | ICD-10-CM

## 2017-05-30 DIAGNOSIS — M25552 Pain in left hip: Secondary | ICD-10-CM

## 2017-05-30 NOTE — Therapy (Signed)
[facility] [address] [place], [place], [address] Phone: [phone]   Fax:  [phone]  Physical Therapy Treatment  Patient Details  Name: Michael Shea MRN: [id number] Date of Birth: 02/05/1956 Referring Provider: Gershon MusselNaiping Xu, MD   Encounter Date: 05/30/2017  PT End of Session - 05/30/17 1503    Visit Number  7    Number of Visits  12    Date for PT Re-Evaluation  06/08/17    Authorization Type  CAFA - need new dates ( per patient through 11/09/2017 )     PT Start Time  1501    PT Stop Time  1555 limited total treatment time due to pt taking 15 min restroom break    PT Time Calculation (min)  54 min    Activity Tolerance  Patient tolerated treatment well    Behavior During Therapy  [facility] for tasks assessed/performed       Past Medical History:  Diagnosis Date  . Arthritis   . Seizures ([facility])   . Sleep walking     History reviewed. No pertinent surgical history.  There were no vitals filed for this visit.  Subjective Assessment - 05/30/17 1505    Subjective  "I have to keep practicing it,     Currently in Pain?  Yes    Pain Score  5     Pain Location  Hip    Pain Orientation  Left    Pain Descriptors / Indicators  Aching;Sore;Tightness    Pain Type  Chronic pain    Pain Onset  More than a month ago    Pain Frequency  Intermittent    Aggravating Factors   Stairs, lifting     Pain Relieving Factors  sitting, Rx gel                      [facility] Adult PT Treatment/Exercise - 05/30/17 1513      Knee/Hip Exercises: Stretches   Hip Flexor Stretch  2 reps;30 seconds;Other (comment) mulitple verbal cues for proper form    Piriformis Stretch  2 reps;30 seconds multple verbal cues for proper form      Knee/Hip Exercises: Aerobic   Nustep  L5 x 6 min UE/LE      Knee/Hip Exercises: Seated   Other Seated Knee/Hip Exercises  hip IR/ ER 2 x 10 yellow theraband. tactile cues for proper form.      Knee/Hip  Exercises: Supine   Bridges  10 reps;2 sets      Moist Heat Therapy   Number Minutes Moist Heat  10 Minutes    Moist Heat Location  Hip supine      Manual Therapy   Manual Therapy  Joint mobilization    Manual therapy comments  --    Joint Mobilization  Long axis distraction grade 4, belt mobs with hip IR/ER grade 3-4             PT Education - 05/30/17 1527    Education provided  Yes    Education Details  pt requires multiple cues verbal/ tactile for appropriate form with stretch/ HEP. Discussed at length clinic policy how to progress with appointments, due to multiple no-shows.     Person(s) Educated  Patient    Methods  Explanation;Tactile cues;Verbal cues;Demonstration    Comprehension  Verbalized understanding;Verbal cues required       PT Short Term Goals - 05/24/17 1518      PT SHORT TERM GOAL #  1   Title  He will be independent with initial HEP     Baseline  05/24/17: not consistent.  needs cues    Status  On-going        PT Long Term Goals - 04/26/17 1023      PT LONG TERM GOAL #1   Title  He will be independent with all HEP issued    Time  8    Period  Weeks    Status  On-going      PT LONG TERM GOAL #2   Title  He will report Rt hip /back pain decreased 50% with walking to be able to walk 2 blocks    Time  5    Period  Weeks    Status  On-going      PT LONG TERM GOAL #3   Title  He will be able to sit for > 30 min without increased pain    Time  5    Period  Weeks    Status  On-going      PT LONG TERM GOAL #4   Title  He will report pain as intermittant in back and hip    Time  5    Period  Weeks    Status  On-going            Plan - 05/30/17 1528    Clinical Impression Statement  Pt continues to report pain in the L hip. limited session today due to pt having to take a 15 min restroom break during session. focused on hip mobility via belt mobilization. reviewed stretching techniques which he required multiple verbal cues/  demonstration for proper form.        Patient will benefit from skilled therapeutic intervention in order to improve the following deficits and impairments:  Pain, Difficulty walking, Decreased activity tolerance, Decreased strength, Decreased range of motion, Increased muscle spasms, Postural dysfunction  Visit Diagnosis: No diagnosis found.     Problem List Patient Active Problem List   Diagnosis Date Noted  . Vitamin D deficiency 04/20/2017  . Osteoarthritis 10/09/2016  . Seizures ([facility]) 12/16/2015  . Chronic left hip pain 12/16/2015   Lulu Riding PT, DPT, LAT, ATC  05/30/17  3:43 PM      [facility] [address] [place], [place], [address] Phone: [phone]   Fax:  [phone]  Name: Michael Shea MRN: [id number] Date of Birth: Aug 27, 1955

## 2017-05-31 NOTE — Telephone Encounter (Signed)
Mychart concern

## 2017-06-04 ENCOUNTER — Ambulatory Visit: Payer: Self-pay

## 2017-06-06 ENCOUNTER — Ambulatory Visit: Payer: Self-pay | Attending: Orthopaedic Surgery | Admitting: Physical Therapy

## 2017-06-06 DIAGNOSIS — M25651 Stiffness of right hip, not elsewhere classified: Secondary | ICD-10-CM

## 2017-06-06 DIAGNOSIS — M25552 Pain in left hip: Secondary | ICD-10-CM | POA: Insufficient documentation

## 2017-06-06 DIAGNOSIS — R262 Difficulty in walking, not elsewhere classified: Secondary | ICD-10-CM

## 2017-06-06 DIAGNOSIS — M6281 Muscle weakness (generalized): Secondary | ICD-10-CM | POA: Insufficient documentation

## 2017-06-06 DIAGNOSIS — M25652 Stiffness of left hip, not elsewhere classified: Secondary | ICD-10-CM

## 2017-06-06 DIAGNOSIS — M25551 Pain in right hip: Secondary | ICD-10-CM

## 2017-06-06 NOTE — Therapy (Addendum)
[facility] Outpatient Rehabilitation [facility] [address] [place], [place], [address] Phone: [phone]   Fax:  [phone]  Physical Therapy Treatment  Patient Details  Name: Michael Shea MRN: [id number] Date of Birth: 1955-08-08 Referring Provider: Gershon Mussel, MD   Encounter Date: 06/06/2017  PT End of Session - 06/06/17 1529    Visit Number  8    Number of Visits  12    Date for PT Re-Evaluation  06/08/17    Authorization Type  CAFA - need new dates ( per patient through 11/09/2017 )     PT Start Time  1501    PT Stop Time  1552    PT Time Calculation (min)  51 min    Activity Tolerance  Patient tolerated treatment well    Behavior During Therapy  [facility] for tasks assessed/performed       Past Medical History:  Diagnosis Date  . Arthritis   . Seizures ([facility])   . Sleep walking     No past surgical history on file.  There were no vitals filed for this visit.  Subjective Assessment - 06/06/17 1507    Subjective  " I am having issues with the hip and the back area with it being worse on the L side"    Currently in Pain?  Yes    Pain Score  5     Pain Orientation  Left    Pain Descriptors / Indicators  Aching;Sore;Tightness    Aggravating Factors   laying down, stairs, lifting    Pain Relieving Factors  sitting, gel.          [facility] PT Assessment - 06/06/17 1514      ROM / Strength   AROM / PROM / Strength  Strength      AROM   Overall AROM Comments  stays in 26 degrees of external rotation    AROM Assessment Site  Hip    Right/Left Hip  Left    Left Hip Extension  -10    Left Hip Flexion  60    Left Hip External Rotation   14 starts in 26 degrees    Left Hip Internal Rotation   -10 from getting to neutral      PROM   Left Hip Extension  5    Left Hip External Rotation   -- starts in 26 degrees      Strength   Strength Assessment Site  Knee;Hip    Right/Left Hip  Left    Left Hip Flexion  4-/5                  [facility] Adult  PT Treatment/Exercise - 06/06/17 1527      Knee/Hip Exercises: Stretches   Hip Flexor Stretch  2 reps;30 seconds    Piriformis Stretch  2 reps;30 seconds      Knee/Hip Exercises: Aerobic   Nustep  L5 x 6 min      Knee/Hip Exercises: Supine   Bridges with Ball Squeeze  2 sets;10 reps squeezing pink ball    Other Supine Knee/Hip Exercises  hip IR squeezing ball between feet 2 x 10 holding 5 sec each      Knee/Hip Exercises: Sidelying   Clams  2x15; blue theraband on 2nd set      Moist Heat Therapy   Number Minutes Moist Heat  10 Minutes    Moist Heat Location  Hip in supine      Manual Therapy   Joint  Mobilization  Long axis distraction grade 4,              PT Education - 06/06/17 1542    Education provided  Yes    Education Details  reviewed HEP which he continues to required multiple cues for form. discussed at length that in order to continued with treatment we need to show progress  and he continues to report improvement and demonstrate limited hip mobility. Discussed due to hip pain effecting gait which in turn is effecting his back.     Person(s) Educated  Patient    Methods  Explanation;Verbal cues    Comprehension  Verbalized understanding;Verbal cues required;Need further instruction       PT Short Term Goals - 05/24/17 1518      PT SHORT TERM GOAL #1   Title  He will be independent with initial HEP     Baseline  05/24/17: not consistent.  needs cues    Status  On-going        PT Long Term Goals - 04/26/17 1023      PT LONG TERM GOAL #1   Title  He will be independent with all HEP issued    Time  8    Period  Weeks    Status  On-going      PT LONG TERM GOAL #2   Title  He will report Rt hip /back pain decreased 50% with walking to be able to walk 2 blocks    Time  5    Period  Weeks    Status  On-going      PT LONG TERM GOAL #3   Title  He will be able to sit for > 30 min without increased pain    Time  5    Period  Weeks    Status  On-going       PT LONG TERM GOAL #4   Title  He will report pain as intermittant in back and hip    Time  5    Period  Weeks    Status  On-going            Plan - 06/06/17 1545    Clinical Impression Statement  discussed at length todays assessment and in order to continue with PT we need to show progress. pt reports continued relief of pain with stretching and exercises but exhibits continued limited hip mobility and significant sway / lateral hip abducted circumducted pattern which is potentially effecting his back.  continued strengthening for the hip and mobs to promote hip mobiltiy which he reported relief of pain but continued to exhibit hip ER and antalgic gait pattern. MHP post session to calm down soreness.     PT Treatment/Interventions  Moist Heat;Ultrasound;Iontophoresis 4mg /ml Dexamethasone;Therapeutic exercise;Therapeutic activities;Manual techniques;Patient/family education;Dry needling;Passive range of motion    PT Next Visit Plan  manual therapy to hips/back; review HEP and progress as tolerated. if no progress is made discuss  discharge.     PT Home Exercise Plan  Sit-stand, bridge, calf stretch, piriformis stretch, hamstring stretch, butterfly, hip flexor supine stretch,  clam with band    Consulted and Agree with Plan of Care  Patient       Patient will benefit from skilled therapeutic intervention in order to improve the following deficits and impairments:  Pain, Difficulty walking, Decreased activity tolerance, Decreased strength, Decreased range of motion, Increased muscle spasms, Postural dysfunction  Visit Diagnosis: Pain in right hip  Stiffness of right hip,  not elsewhere classified  Difficulty in walking, not elsewhere classified  Stiffness of left hip, not elsewhere classified     Problem List Patient Active Problem List   Diagnosis Date Noted  . Vitamin D deficiency 04/20/2017  . Osteoarthritis 10/09/2016  . Seizures ([facility]) 12/16/2015  . Chronic left hip  pain 12/16/2015   Lulu RidingKristoffer Kalenna Millett PT, DPT, LAT, ATC  06/06/17  4:33 PM      [facility] [address] [place], [place], [address] Phone: [phone]   Fax:  [phone]  Name: Michael Shea MRN: [id number] Date of Birth: 05/14/1955

## 2017-06-08 MED FILL — VIT D2 1.25 MG (50,000 UNIT: 1.25 MG | 28 days supply | Qty: 4 | Fill #2

## 2017-06-10 NOTE — Congregational Nurse Program (Signed)
Congregational Nurse Program Note  Date of Encounter: 05/11/2017  Past Medical History: Past Medical History:  Diagnosis Date  . Arthritis   . Seizures ([facility])   . Sleep walking     Encounter Details:  Client wants CN to write note for bedrest. CN explained that bedrest must be collaborative and that CN can only make recommendations to staff. CN states that client should speak with staff regarding current bedrest needs and return to clinic to discuss.

## 2017-06-11 ENCOUNTER — Other Ambulatory Visit: Payer: Self-pay

## 2017-06-11 ENCOUNTER — Emergency Department (HOSPITAL_COMMUNITY)
Admission: EM | Admit: 2017-06-11 | Discharge: 2017-06-11 | Disposition: A | Discharge status: Home / Self Care | Payer: Self-pay | Attending: Emergency Medicine | Admitting: Emergency Medicine

## 2017-06-11 ENCOUNTER — Encounter (HOSPITAL_COMMUNITY): Payer: Self-pay

## 2017-06-11 ENCOUNTER — Ambulatory Visit: Payer: Self-pay | Attending: Family Medicine

## 2017-06-11 DIAGNOSIS — G40909 Epilepsy, unspecified, not intractable, without status epilepticus: Secondary | ICD-10-CM

## 2017-06-11 DIAGNOSIS — Z79899 Other long term (current) drug therapy: Secondary | ICD-10-CM | POA: Insufficient documentation

## 2017-06-11 DIAGNOSIS — R569 Unspecified convulsions: Secondary | ICD-10-CM | POA: Insufficient documentation

## 2017-06-11 DIAGNOSIS — M175 Other unilateral secondary osteoarthritis of knee: Secondary | ICD-10-CM

## 2017-06-11 DIAGNOSIS — G8929 Other chronic pain: Secondary | ICD-10-CM | POA: Insufficient documentation

## 2017-06-11 MED ORDER — DICLOFENAC SODIUM 1 % TD GEL: 4.0000 g | Freq: Four times a day (QID) | TRANSDERMAL | 2 refills | Status: DC

## 2017-06-11 MED ORDER — PHENYTOIN SODIUM EXTENDED 300 MG PO CAPS
300.0000 mg | ORAL_CAPSULE | Freq: Every day | ORAL | 0 refills | Status: DC
Start: 2017-06-11 — End: 2017-06-21

## 2017-06-11 MED ORDER — PHENYTOIN SODIUM EXTENDED 300 MG PO CAPS: 300.0000 mg | ORAL_CAPSULE | Freq: Every day | ORAL | 3 refills | Status: DC

## 2017-06-11 MED FILL — DICLOFENAC SODIUM 1% GEL: 1 | 6 days supply | Qty: 100 | Fill #2

## 2017-06-11 NOTE — ED Notes (Signed)
Pt in room

## 2017-06-11 NOTE — ED Triage Notes (Signed)
Patient states that he is here to get RX's for his meds. Has lost his current ones. Needs dilantin and pain cream

## 2017-06-11 NOTE — ED Provider Notes (Signed)
[facility] EMERGENCY DEPARTMENT Provider Note   CSN: [id number] Arrival date & time: 06/11/17  1129     History   Chief Complaint No chief complaint on file.   HPI Neko Boyajian is a 62 y.o. male.  HPI   61 year old male presents today for medication refill.  Patient reports history of seizures.  He notes that he has not had an full-blown seizure in the last 2 years as he has been taking Dilantin.  Patient notes that he did have an episode in February.  Was uncertain if this was truly a seizure.  Patient notes that he has not run out of the Dilantin but requires a prescription so that he does not run out noting he has proximally 4 pills left.  Patient also reports he has chronic pain in the knee left hip and lower extremity that he uses diclofenac gel for which seems to improve his symptoms.  Patient is also requesting 1 week of bedrest note for the homeless shelter.  Past Medical History:  Diagnosis Date  . Arthritis   . Seizures (HCC)   . Sleep walking     Patient Active Problem List   Diagnosis Date Noted  . Vitamin D deficiency 04/20/2017  . Osteoarthritis 10/09/2016  . Seizures (HCC) 12/16/2015  . Chronic left hip pain 12/16/2015    History reviewed. No pertinent surgical history.     Home Medications    Prior to Admission medications   Medication Sig Start Date End Date Taking? Authorizing Provider  atorvastatin (LIPITOR) 20 MG tablet Take 1 tablet (20 mg total) by mouth daily. 04/20/17   Hoy Register, MD  diclofenac sodium (VOLTAREN) 1 % GEL Apply 4 g topically 4 (four) times daily. 06/11/17   Jru Pense, Tinnie Gens, PA-C  ergocalciferol (DRISDOL) 50000 units capsule Take 1 capsule (50,000 Units total) by mouth once a week. 04/20/17   Hoy Register, MD  phenytoin (DILANTIN) 300 MG ER capsule Take 1 capsule (300 mg total) by mouth at bedtime. 06/11/17   Eyvonne Mechanic, PA-C    Family History Family History  Problem Relation Age of Onset  .  Heart failure Mother   . Diabetes Father     Social History Social History   Tobacco Use  . Smoking status: Never Smoker  . Smokeless tobacco: Never Used  Substance Use Topics  . Alcohol use: No  . Drug use: No     Allergies   Patient has no known allergies.   Review of Systems Review of Systems  All other systems reviewed and are negative.    Physical Exam Updated Vital Signs BP (!) 146/97   Pulse 63   Temp 97.7 F (36.5 C) (Oral)   Resp 18   SpO2 97%   Physical Exam  Constitutional: He is oriented to person, place, and time. He appears well-developed and well-nourished.  HENT:  Head: Normocephalic and atraumatic.  Eyes: Conjunctivae are normal. Pupils are equal, round, and reactive to light. Right eye exhibits no discharge. Left eye exhibits no discharge. No scleral icterus.  Neck: Normal range of motion. No JVD present. No tracheal deviation present.  Cardiovascular: Normal rate and regular rhythm.  Pulmonary/Chest: Effort normal and breath sounds normal. No stridor. No respiratory distress. He has no wheezes. He has no rales.  Neurological: He is alert and oriented to person, place, and time. Coordination normal.  Psychiatric: He has a normal mood and affect. His behavior is normal. Judgment and thought content normal.  Nursing note and  vitals reviewed.    ED Treatments / Results  Labs (all labs ordered are listed, but only abnormal results are displayed) Labs Reviewed - No data to display  EKG  EKG Interpretation None       Radiology No results found.  Procedures Procedures (including critical care time)  Medications Ordered in ED Medications - No data to display   Initial Impression / Assessment and Plan / ED Course  I have reviewed the triage vital signs and the nursing notes.  Pertinent labs & imaging results that were available during my care of the patient were reviewed by me and considered in my medical decision making (see chart  for details).      Final Clinical Impressions(s) / ED Diagnoses   Final diagnoses:  Seizure disorder [facility](HCC)  Other chronic pain    62 year old male presents today for medication refill.  He has a history of seizures.  He notes he has not quite run out of medication but will and does not have a neurologist here in town.  Patient reports that he sees a neurologist in [place].  Patient will be given 1 refill for his Dilantin, also a refill of his diclofenac gel for chronic arthritis.  Patient is requesting that I write him a note for the homeless shelter reporting that he needs 1 week of bedrest at their facility, this does not seem appropriate and will not be given here today.  Given strict return precautions, he verbalized understanding and agreement to today's plan.  ED Discharge Orders        Ordered    phenytoin (DILANTIN) 300 MG ER capsule  Daily at bedtime     06/11/17 1226    diclofenac sodium (VOLTAREN) 1 % GEL  4 times daily     06/11/17 1226       Rosalio LoudHedges, Jimmy Plessinger, PA-C 06/11/17 1227    Azalia Bilisampos, Kevin, MD 06/11/17 1701

## 2017-06-11 NOTE — Discharge Instructions (Signed)
Please read attached information. If you experience any new or worsening signs or symptoms please return to the emergency room for evaluation. Please follow-up with your primary care provider or specialist as discussed. Please use medication prescribed only as directed and discontinue taking if you have any concerning signs or symptoms.   °

## 2017-06-13 ENCOUNTER — Ambulatory Visit: Payer: Self-pay | Admitting: Physical Therapy

## 2017-06-13 DIAGNOSIS — R262 Difficulty in walking, not elsewhere classified: Secondary | ICD-10-CM

## 2017-06-13 DIAGNOSIS — M25652 Stiffness of left hip, not elsewhere classified: Secondary | ICD-10-CM

## 2017-06-13 DIAGNOSIS — M25551 Pain in right hip: Secondary | ICD-10-CM

## 2017-06-13 DIAGNOSIS — M25552 Pain in left hip: Secondary | ICD-10-CM

## 2017-06-13 DIAGNOSIS — M6281 Muscle weakness (generalized): Secondary | ICD-10-CM

## 2017-06-13 DIAGNOSIS — M25651 Stiffness of right hip, not elsewhere classified: Secondary | ICD-10-CM

## 2017-06-13 NOTE — Therapy (Signed)
[place], [place], [address] Phone: [phone]   Fax:  [phone]  Physical Therapy Treatment / Re-certification   Patient Details  Name: Michael Shea MRN: [id number] Date of Birth: 08-Sep-1955 Referring Provider: Frankey Shown, MD   Encounter Date: 06/13/2017  PT End of Session - 06/13/17 1522    Visit Number  9    Number of Visits  12    Date for PT Re-Evaluation  07/04/17    PT Start Time  6962 pt arrived 15 minutes late    PT Stop Time  1555    PT Time Calculation (min)  40 min    Activity Tolerance  Patient tolerated treatment well    Behavior During Therapy  [facility] for tasks assessed/performed       Past Medical History:  Diagnosis Date  . Arthritis   . Seizures ([place])   . Sleep walking     No past surgical history on file.  There were no vitals filed for this visit.  Subjective Assessment - 06/13/17 1516    Subjective  "I am scheduled to see Dr. Ninfa Linden for my hip and to get rest because I need rest"    Currently in Pain?  Yes    Pain Score  7     Pain Orientation  Left    Pain Type  Chronic pain    Pain Onset  More than a month ago    Aggravating Factors   walking, standing,     Pain Relieving Factors  stting, gel medication         [facility] PT Assessment - 06/13/17 1524      AROM   Left Hip Extension  -10    Left Hip Flexion  60    Left Hip External Rotation   14 starts 26 degrees    Left Hip Internal Rotation   -10 from neutral      Strength   Left Hip Flexion  4-/5                  [facility] Adult PT Treatment/Exercise - 06/13/17 1526      Knee/Hip Exercises: Stretches   Hip Flexor Stretch  -- prone with glute set 2 x 10 holding 5 sec      Knee/Hip Exercises: Aerobic   Nustep  L5 x 6 min UE/LE      Knee/Hip Exercises: Standing   Gait Training  standing walking heel to toe and avoiding lateral sway and circumducated gait 3 x 30 ft, multiple cues need to remind to  slow down for proper form.       Knee/Hip Exercises: Seated   Long Arc Quad  2 sets;15 reps;Weights with ball squeeze    Long Arc Quad Weight  3 lbs.    Sit to Sand  2 sets;10 reps;without UE support with balls squeeze      Knee/Hip Exercises: Supine   Bridges with Ball Squeeze  2 sets;15 reps    Other Supine Knee/Hip Exercises  hip IR squeezing ball between feet 2 x 10 holding 5 sec each      Knee/Hip Exercises: Sidelying   Hip ABduction  Strengthening;2 sets;10 reps bil      Moist Heat Therapy   Number Minutes Moist Heat  10 Minutes    Moist Heat Location  Hip      Manual Therapy   Joint Mobilization  Long axis distraction grade 4,  PT Short Term Goals - 06/13/17 1621      PT SHORT TERM GOAL #1   Title  He will be independent with initial HEP     Baseline  needs cues    Time  2    Period  Weeks    Status  Partially Met        PT Long Term Goals - 06/13/17 1620      PT LONG TERM GOAL #1   Title  He will be independent with all HEP issued    Baseline  needs frequent cues for form    Time  4    Period  Weeks    Status  On-going    Target Date  07/11/17      PT LONG TERM GOAL #2   Title  He will report Rt hip /back pain decreased 50% with walking to be able to walk 2 blocks    Time  4    Period  Weeks    Status  On-going    Target Date  07/11/17      PT LONG TERM GOAL #3   Title  He will be able to sit for > 30 min without increased pain    Time  4    Period  Weeks    Status  On-going    Target Date  07/11/17      PT LONG TERM GOAL #4   Title  He will report pain as intermittant in back and hip    Time  4    Period  Weeks    Status  On-going    Target Date  07/11/17            Plan - 06/13/17 1547    Clinical Impression Statement  Focused primarly with hip strengthening in supine/ sitting/ standing which he reports relief of pain. He continues to demo limited hip mobility and pain. practiced gait training utilizing proper  form which he requires multiple verbal cues for appropriate form and to slow down. pt reports relief of pain with exercise. plan to continued strengthening and gait training techniques. If no progress is noted in the next few visits plan to discharge and refer back to his MD.     PT Frequency  1x / week    PT Duration  4 weeks    PT Treatment/Interventions  Moist Heat;Ultrasound;Iontophoresis '4mg'$ /ml Dexamethasone;Therapeutic exercise;Therapeutic activities;Manual techniques;Patient/family education;Dry needling;Passive range of motion    PT Next Visit Plan  manual therapy to hips/back; review HEP and progress as tolerated. If no progress continues then discuss D/C    PT Home Exercise Plan  Sit-stand, bridge, calf stretch, piriformis stretch, hamstring stretch, butterfly, hip flexor supine stretch,  clam with band    Consulted and Agree with Plan of Care  Patient       Patient will benefit from skilled therapeutic intervention in order to improve the following deficits and impairments:  Pain, Difficulty walking, Decreased activity tolerance, Decreased strength, Decreased range of motion, Increased muscle spasms, Postural dysfunction  Visit Diagnosis: Pain in right hip  Stiffness of right hip, not elsewhere classified  Difficulty in walking, not elsewhere classified  Stiffness of left hip, not elsewhere classified  Pain in left hip  Muscle weakness (generalized)     Problem List Patient Active Problem List   Diagnosis Date Noted  . Vitamin D deficiency 04/20/2017  . Osteoarthritis 10/09/2016  . Seizures ([place]) 12/16/2015  . Chronic left hip pain 12/16/2015  Starr Lake PT, DPT, LAT, ATC  06/13/17  4:24 PM      [facility] [address] [place], [place], [address] Phone: [phone]   Fax:  [phone]  Name: Michael Shea MRN: [id number] Date of Birth: 09-03-1955

## 2017-06-15 MED FILL — PHENYTOIN SOD EXT 300 MG CA: 300 | 30 days supply | Qty: 30 | Fill #1

## 2017-06-17 ENCOUNTER — Other Ambulatory Visit: Payer: Self-pay

## 2017-06-17 ENCOUNTER — Encounter (HOSPITAL_COMMUNITY): Payer: Self-pay

## 2017-06-17 ENCOUNTER — Emergency Department (HOSPITAL_COMMUNITY)
Admission: EM | Admit: 2017-06-17 | Discharge: 2017-06-17 | Disposition: A | Discharge status: Home / Self Care | Payer: Self-pay | Attending: Emergency Medicine | Admitting: Emergency Medicine

## 2017-06-17 DIAGNOSIS — Z79899 Other long term (current) drug therapy: Secondary | ICD-10-CM | POA: Insufficient documentation

## 2017-06-17 DIAGNOSIS — F513 Sleepwalking [somnambulism]: Secondary | ICD-10-CM | POA: Insufficient documentation

## 2017-06-17 DIAGNOSIS — G4700 Insomnia, unspecified: Secondary | ICD-10-CM | POA: Insufficient documentation

## 2017-06-17 MED ORDER — HYDROXYZINE HCL 25 MG PO TABS: 25.0000 mg | ORAL_TABLET | Freq: Four times a day (QID) | ORAL | 0 refills | Status: AC

## 2017-06-17 NOTE — ED Triage Notes (Signed)
Pt states he is here today because he has been sleep walking. Pt states he needs referral to a doctor to have a sleep study.

## 2017-06-17 NOTE — Discharge Instructions (Signed)
Take Hydroxyzine before bedtime for sleep Follow up with Dr. Alvis LemmingsNewlin for referral for sleep study Follow up with neurology as well for seizures

## 2017-06-17 NOTE — ED Provider Notes (Signed)
[facility] EMERGENCY DEPARTMENT Provider Note   CSN: [id number] Arrival date & time: 06/17/17  1854     History   Chief Complaint Chief Complaint  Patient presents with  . Insomnia    Sleep Walking    HPI Michael Shea is a 62 y.o. male who presents with complaints of insomnia and sleepwalking.  Past medical history significant for seizures, arthritis, homelessness.  He states that he is dealt with insomnia and sleep walking for a very long period of time.  He is a rambling historian and difficult to follow for history of limited.  He was previously prescribed clonidine for insomnia however he did not tolerate this medicine well.  It appears he saw his primary doctor on 12/21 complaining of the same problem and was referred to psychiatry and for sleep study.  The patient cannot recall that this happened and does not recall his primary doctor's name as well.  He is requesting a referral for a sleep study.  He states he has had one in the past and was given a CPAP machine.  He is also asking for a medication for sleep.  HPI  Past Medical History:  Diagnosis Date  . Arthritis   . Seizures (HCC)   . Sleep walking     Patient Active Problem List   Diagnosis Date Noted  . Vitamin D deficiency 04/20/2017  . Osteoarthritis 10/09/2016  . Seizures (HCC) 12/16/2015  . Chronic left hip pain 12/16/2015    History reviewed. No pertinent surgical history.     Home Medications    Prior to Admission medications   Medication Sig Start Date End Date Taking? Authorizing Provider  atorvastatin (LIPITOR) 20 MG tablet Take 1 tablet (20 mg total) by mouth daily. 04/20/17   Hoy Register, MD  diclofenac sodium (VOLTAREN) 1 % GEL Apply 4 g topically 4 (four) times daily. 06/11/17   Hedges, Tinnie Gens, PA-C  ergocalciferol (DRISDOL) 50000 units capsule Take 1 capsule (50,000 Units total) by mouth once a week. 04/20/17   Hoy Register, MD  hydrOXYzine (ATARAX/VISTARIL) 25 MG  tablet Take 1 tablet (25 mg total) by mouth every 6 (six) hours. 06/17/17   Bethel Born, PA-C  phenytoin (DILANTIN) 300 MG ER capsule Take 1 capsule (300 mg total) by mouth at bedtime. 06/11/17   Eyvonne Mechanic, PA-C    Family History Family History  Problem Relation Age of Onset  . Heart failure Mother   . Diabetes Father     Social History Social History   Tobacco Use  . Smoking status: Never Smoker  . Smokeless tobacco: Never Used  Substance Use Topics  . Alcohol use: No  . Drug use: No     Allergies   Patient has no known allergies.   Review of Systems Review of Systems  Respiratory: Negative for shortness of breath.   Cardiovascular: Negative for chest pain.  Gastrointestinal: Negative for abdominal pain.  Neurological: Negative for seizures and headaches.  Psychiatric/Behavioral: Positive for sleep disturbance.     Physical Exam Updated Vital Signs BP (!) 153/84 (BP Location: Right Arm)   Pulse 85   Temp 98.2 F (36.8 C) (Oral)   Resp 18   SpO2 96%   Physical Exam  Constitutional: He is oriented to person, place, and time. He appears well-developed and well-nourished. No distress.  HENT:  Head: Normocephalic and atraumatic.  Eyes: Conjunctivae are normal. Pupils are equal, round, and reactive to light. Right eye exhibits no discharge. Left eye exhibits  no discharge. No scleral icterus.  Neck: Normal range of motion.  Cardiovascular: Normal rate.  Pulmonary/Chest: Effort normal. No respiratory distress.  Abdominal: He exhibits no distension.  Neurological: He is alert and oriented to person, place, and time.  Skin: Skin is warm and dry.  Psychiatric: He has a normal mood and affect. His behavior is normal. His speech is rapid and/or pressured and tangential. He expresses impulsivity.  Nursing note and vitals reviewed.    ED Treatments / Results  Labs (all labs ordered are listed, but only abnormal results are displayed) Labs Reviewed - No  data to display  EKG  EKG Interpretation None       Radiology No results found.  Procedures Procedures (including critical care time)  Medications Ordered in ED Medications - No data to display   Initial Impression / Assessment and Plan / ED Course  I have reviewed the triage vital signs and the nursing notes.  Pertinent labs & imaging results that were available during my care of the patient were reviewed by me and considered in my medical decision making (see chart for details).  62 year old male presents with a request for a referral to have a sleep study done and medication for insomnia. I advised that we do not refer for sleep studies in the emergency department.  I also advised that he needs to follow-up with his doctor because they previously had already referred him.  He will be given a prescription for hydroxyzine.  He is advised to follow-up with his doctor and neurology.  Final Clinical Impressions(s) / ED Diagnoses   Final diagnoses:  Insomnia, unspecified type    ED Discharge Orders        Ordered    hydrOXYzine (ATARAX/VISTARIL) 25 MG tablet  Every 6 hours     06/17/17 2057       Bethel BornGekas, Kelly Marie, PA-C 06/17/17 2107    Lorre NickAllen, Anthony, MD 06/17/17 2348

## 2017-06-18 ENCOUNTER — Encounter (INDEPENDENT_AMBULATORY_CARE_PROVIDER_SITE_OTHER): Payer: Self-pay | Admitting: Orthopaedic Surgery

## 2017-06-18 ENCOUNTER — Emergency Department (HOSPITAL_COMMUNITY): Payer: Self-pay

## 2017-06-18 ENCOUNTER — Ambulatory Visit (INDEPENDENT_AMBULATORY_CARE_PROVIDER_SITE_OTHER): Payer: Self-pay | Admitting: Orthopaedic Surgery

## 2017-06-18 DIAGNOSIS — Z5321 Procedure and treatment not carried out due to patient leaving prior to being seen by health care provider: Secondary | ICD-10-CM | POA: Insufficient documentation

## 2017-06-18 DIAGNOSIS — M1612 Unilateral primary osteoarthritis, left hip: Secondary | ICD-10-CM

## 2017-06-18 MED FILL — hydrOXYzine HCL 25 MG TABS: 25 | 3 days supply | Qty: 12 | Fill #0

## 2017-06-18 NOTE — ED Triage Notes (Signed)
Pt states that he was told he needs to have surgery on his L hip about three years ago. Pt states that pain is worse when he lays on his L side, no new injuries

## 2017-06-18 NOTE — Progress Notes (Signed)
Patient is someone that has been seen at this office before by 1 of my partners for left hip osteoarthritis.  He had x-rays in both October of last year and in December showing severe end-stage arthritis of his left hip.  The x-rays show complete loss of joint space and actually shortening on that side.  At the time he said he can barely walk but he has actually been to physical therapy and he said that is helped.  He is someone who is unfortunately homeless and positive for places to stay at times.  He is rambling quite a bit in the office as well.  He says that he was at the emergency room today to get his medications he was there again on the 11th of this month.  He has a history of seizure disorder as well as insomnia.  He says he apparently has a sleep study to be set up.  I asked him where he is staying tonight he says with a friend.  He said he would like to have a note to allow him to have bed rest from 1-3 days at a time to stay off his hip.  He says his hip is feeling somewhat better.  We talked about the possibility of hip replacement surgery but my biggest concern is the fact that he is homeless and that there is uncertainty as to his true medical status in terms of seizure disorder in the past that is listed in epic as well as high blood pressure.  On exam I can move his right hip around easily.  The left hip flexes well but has limitations with internal and external rotation in his left hip is significantly short comparing the left and right sides in terms of his leg being shorter.  X-rays do confirm severe arthritis in his left hip.  From a loss of what else to do for this gentleman right now giving his rambling nature and the fact that he still does not have good control of his health and I am not sure what his true health status is.  Also being the fact that he is homeless he would be difficult to put him through hip replacement surgery without some type of support system in place.  I have  encouraged him to try to get some type of support system.

## 2017-06-19 ENCOUNTER — Emergency Department (HOSPITAL_COMMUNITY)
Admission: EM | Admit: 2017-06-19 | Discharge: 2017-06-19 | Disposition: A | Discharge status: Home / Self Care | Payer: Self-pay | Attending: Emergency Medicine | Admitting: Emergency Medicine

## 2017-06-19 ENCOUNTER — Encounter (HOSPITAL_COMMUNITY): Payer: Self-pay | Admitting: Emergency Medicine

## 2017-06-19 ENCOUNTER — Emergency Department (HOSPITAL_COMMUNITY)
Admission: EM | Admit: 2017-06-19 | Discharge: 2017-06-20 | Disposition: A | Discharge status: Home / Self Care | Payer: Self-pay | Attending: Emergency Medicine | Admitting: Emergency Medicine

## 2017-06-19 ENCOUNTER — Other Ambulatory Visit: Payer: Self-pay

## 2017-06-19 DIAGNOSIS — G8929 Other chronic pain: Secondary | ICD-10-CM

## 2017-06-19 DIAGNOSIS — M25552 Pain in left hip: Secondary | ICD-10-CM | POA: Insufficient documentation

## 2017-06-19 DIAGNOSIS — Z590 Homelessness: Secondary | ICD-10-CM | POA: Insufficient documentation

## 2017-06-19 NOTE — ED Notes (Signed)
Pt called x2 with no response °

## 2017-06-19 NOTE — ED Notes (Signed)
Patient was called to reassess vital signs patient did not answer.  

## 2017-06-19 NOTE — ED Triage Notes (Signed)
Pt presents for same complaints as last night. Pt LWBS d/t wait time. Pt reports L leg pain which is chronic in nature. Also reports HA.

## 2017-06-20 MED ORDER — ACETAMINOPHEN 500 MG PO TABS
1000.0000 mg | ORAL_TABLET | Freq: Once | ORAL | Status: AC
  Administered 2017-06-20: 1000 mg via ORAL
  Filled 2017-06-20: qty 2

## 2017-06-20 NOTE — Discharge Instructions (Signed)
Take tylenol for pain. You can take up to 1000 mg of tylenol every 8 hours. Continue all home medications. Follow up with physical therapy for your hip, this appears to be the best treatment for your chronic pain.Follow up with Dr. BlackmannLarey Dresser (orthopedist surgeon) for further pain control.   Your x-ray results form yesterday are below.   EXAM: DG HIP (WITH OR WITHOUT PELVIS) 2-3V LEFT   COMPARISON:  02/29/2016   FINDINGS: Redemonstration of marked osteoarthritic change about the left hip with flattened weight-bearing portion of the left femoral head, subchondral sclerosis and degenerative cystic change involving the acetabulum and left femoral head. No acute fracture. Lower lumbar degenerative disc disease L4-5 and L5-S1 with associated facet arthropathy. Chronic stable moderate joint space narrowing of the native right hip. No acute pelvic fracture. No diastasis.   IMPRESSION: Marked osteoarthritic joint space narrowing, subchondral sclerosis and degenerative cystic change of the left hip with flattened appearance of the left femoral head.   Lower lumbar degenerative disc and facet arthropathy L4 through S1.     Electronically Signed   By: Tollie Ethavid  Kwon M.D.   On: 06/18/2017 23:19

## 2017-06-20 NOTE — ED Provider Notes (Signed)
[facility] Provider Note   CSN: [id number] Arrival date & time: 06/19/17  2234     History   Chief Complaint Chief Complaint  Patient presents with  . Multiple Complaints    HPI Michael Shea is a 62 y.o. male with history of seizures, homelessness, chronic left hip pain and is here for follow-up on his x-ray results from yesterday. Patient states he was here for left hip pain and he had an x-ray done and he left without being seen due to wait times and he is wondering what the results of the x-rays are. States he has had left hip pain for "a very long time". He has been doing physical therapy for it feels like it is helping. He saw orthopedic surgery Dr. Magnus IvanBlackman a couple of days ago who told that surgery was not recommended. States he was given meloxicam at some point for pain in his hip however states that it is too strong and makes him drowsy, he does not like to take this medicine because he does not want to fall asleep on the street due to safety concerns of being homeless. Has not tried any other over-the-counter medications such as Tylenol or ibuprofen for the pain. He denies any injuries, falls, changes in his pain, groin numbness, bladder or bowel incontinence or retention, and tingling or numbness in his toes. Pain in his hip is worse with flexion and weightbearing or walking for prolonged periods of time. No fevers, chills. States he takes like Dilantin for seizures but "only sometimes" because of this medicine also makes him drowsy and he does not want to fall sleep due to fear for safety.  HPI  Past Medical History:  Diagnosis Date  . Arthritis   . Seizures (HCC)   . Sleep walking     Patient Active Problem List   Diagnosis Date Noted  . Unilateral primary osteoarthritis, left hip 06/18/2017  . Vitamin D deficiency 04/20/2017  . Osteoarthritis 10/09/2016  . Seizures (HCC) 12/16/2015  . Chronic left hip pain 12/16/2015     History reviewed. No pertinent surgical history.     Home Medications    Prior to Admission medications   Medication Sig Start Date End Date Taking? Authorizing Provider  atorvastatin (LIPITOR) 20 MG tablet Take 1 tablet (20 mg total) by mouth daily. 04/20/17   Hoy RegisterNewlin, Enobong, MD  diclofenac sodium (VOLTAREN) 1 % GEL Apply 4 g topically 4 (four) times daily. 06/11/17   Hedges, Tinnie GensJeffrey, PA-C  ergocalciferol (DRISDOL) 50000 units capsule Take 1 capsule (50,000 Units total) by mouth once a week. 04/20/17   Hoy RegisterNewlin, Enobong, MD  hydrOXYzine (ATARAX/VISTARIL) 25 MG tablet Take 1 tablet (25 mg total) by mouth every 6 (six) hours. 06/17/17   Bethel BornGekas, Kelly Marie, PA-C  phenytoin (DILANTIN) 300 MG ER capsule Take 1 capsule (300 mg total) by mouth at bedtime. 06/11/17   Eyvonne MechanicHedges, Jeffrey, PA-C    Family History Family History  Problem Relation Age of Onset  . Heart failure Mother   . Diabetes Father     Social History Social History   Tobacco Use  . Smoking status: Never Smoker  . Smokeless tobacco: Never Used  Substance Use Topics  . Alcohol use: No  . Drug use: No     Allergies   Patient has no known allergies.   Review of Systems Review of Systems  Musculoskeletal: Positive for arthralgias.  All other systems reviewed and are negative.    Physical Exam Updated  Vital Signs BP 136/90 (BP Location: Right Arm)   Pulse 65   Temp 98.9 F (37.2 C) (Oral)   Resp 16   Ht 5\' 10"  (1.778 m)   Wt 95.3 kg (210 lb)   SpO2 97%   BMI 30.13 kg/m   Physical Exam  Constitutional: He is oriented to person, place, and time. He appears well-developed and well-nourished. No distress.  NAD. Speaks very fast, difficulty to understand, able to slow down when asked to to provide adequate history.   HENT:  Head: Normocephalic and atraumatic.  Right Ear: External ear normal.  Left Ear: External ear normal.  Nose: Nose normal.  Eyes: Conjunctivae and EOM are normal. No scleral icterus.   Neck: Normal range of motion. Neck supple.  Cardiovascular: Normal rate, regular rhythm, normal heart sounds and intact distal pulses.  No murmur heard. 2+ Dp and PT pulses bilaterally   Pulmonary/Chest: Effort normal and breath sounds normal. He has no wheezes.  Abdominal: Soft. There is no tenderness.  Musculoskeletal: Normal range of motion. He exhibits no deformity.  No reproducible left hip or pelvis tenderness. Question left left shortening but no obvious rotation. He is able to transfer into/out of chair without significant difficulty. Full PROM of left hip, pain with full flexion and IR/ER. No erythema, edema, warmth, fluctuance or ecchymosis to skin over left hip. No midline L spine tenderenss.   Neurological: He is alert and oriented to person, place, and time.  Sensation to light touch  Intact in lower extremities. 5/5 strength with dorsiflexion and plantar flexion.   Skin: Skin is warm and dry. Capillary refill takes less than 2 seconds.  Psychiatric: He has a normal mood and affect. His behavior is normal. Judgment and thought content normal.  Nursing note and vitals reviewed.    ED Treatments / Results  Labs (all labs ordered are listed, but only abnormal results are displayed) Labs Reviewed - No data to display  EKG  EKG Interpretation None       Radiology Dg Hip Unilat W Or Wo Pelvis 2-3 Views Left  Result Date: 06/18/2017 CLINICAL DATA:  Left hip pain EXAM: DG HIP (WITH OR WITHOUT PELVIS) 2-3V LEFT COMPARISON:  02/29/2016 FINDINGS: Redemonstration of marked osteoarthritic change about the left hip with flattened weight-bearing portion of the left femoral head, subchondral sclerosis and degenerative cystic change involving the acetabulum and left femoral head. No acute fracture. Lower lumbar degenerative disc disease L4-5 and L5-S1 with associated facet arthropathy. Chronic stable moderate joint space narrowing of the native right hip. No acute pelvic fracture. No  diastasis. IMPRESSION: Marked osteoarthritic joint space narrowing, subchondral sclerosis and degenerative cystic change of the left hip with flattened appearance of the left femoral head. Lower lumbar degenerative disc and facet arthropathy L4 through S1. Electronically Signed   By: Tollie Eth M.D.   On: 06/18/2017 23:19    Procedures Procedures (including critical care time)  Medications Ordered in ED Medications  acetaminophen (TYLENOL) tablet 1,000 mg (1,000 mg Oral Given 06/20/17 0047)     Initial Impression / Assessment and Plan / ED Course  I have reviewed the triage vital signs and the nursing notes.  Pertinent labs & imaging results that were available during my care of the patient were reviewed by me and considered in my medical decision making (see chart for details).     62 year old male with history of known, severe osteoarthritic changes of the left hip with flattening of the weight-bearing portion of the left  femoral head is here to follow up on x-rays that were done for him 2 days ago in the emergency department when he came in for his chronic left hip pain. He left without being seen due to weight times and his now requesting x-ray results. No changes to his hip pain recently, knee injuries, falls or trauma. X-ray results reviewed with patient and handed to him at discharge. Exam, as above, does not show signs of septic arthritis or new injury. Grossly neurologically intact. Patient given Tylenol in the emergency department. Chart review reveals patient was seen by Dr. Magnus Ivan with orthopedic surgery 2 days ago for left hip pain who documented conservative management of left hip osteoarthritis with physical therapy. Patient does not appear to be a good surgical candidate due to complex social history, homelessness and questionable medical compliance. I advised patient to take Tylenol for pain as outpatient. Discussed return precautions. Patient verbalized understanding and  agreeable. Final Clinical Impressions(s) / ED Diagnoses   Final diagnoses:  Chronic hip pain, left    ED Discharge Orders    None       Liberty Handy, PA-C 06/20/17 0142    Tegeler, Canary Brim, MD 06/20/17 [phone]

## 2017-06-21 ENCOUNTER — Other Ambulatory Visit: Payer: Self-pay

## 2017-06-21 ENCOUNTER — Encounter (HOSPITAL_COMMUNITY): Payer: Self-pay | Admitting: Emergency Medicine

## 2017-06-21 ENCOUNTER — Emergency Department (HOSPITAL_COMMUNITY)
Admission: EM | Admit: 2017-06-21 | Discharge: 2017-06-21 | Disposition: A | Discharge status: Home / Self Care | Payer: Self-pay | Attending: Emergency Medicine | Admitting: Emergency Medicine

## 2017-06-21 DIAGNOSIS — R569 Unspecified convulsions: Secondary | ICD-10-CM

## 2017-06-21 DIAGNOSIS — G40909 Epilepsy, unspecified, not intractable, without status epilepticus: Secondary | ICD-10-CM | POA: Insufficient documentation

## 2017-06-21 DIAGNOSIS — Z79899 Other long term (current) drug therapy: Secondary | ICD-10-CM | POA: Insufficient documentation

## 2017-06-21 DIAGNOSIS — Z760 Encounter for issue of repeat prescription: Secondary | ICD-10-CM | POA: Insufficient documentation

## 2017-06-21 MED ORDER — PHENYTOIN SODIUM EXTENDED 300 MG PO CAPS: 300.0000 mg | ORAL_CAPSULE | Freq: Every day | ORAL | 0 refills | Status: AC

## 2017-06-21 NOTE — ED Provider Notes (Signed)
[facility] Provider Note   CSN: [id number] Arrival date & time: 06/21/17  0116     History   Chief Complaint Chief Complaint  Patient presents with  . Arthritis    HPI Michael Shea is a 62 y.o. male.  HPI 62 yo male who requests a refill of his dilantin. No other new acute complaints.    Past Medical History:  Diagnosis Date  . Arthritis   . Seizures (HCC)   . Sleep walking     Patient Active Problem List   Diagnosis Date Noted  . Unilateral primary osteoarthritis, left hip 06/18/2017  . Vitamin D deficiency 04/20/2017  . Osteoarthritis 10/09/2016  . Seizures (HCC) 12/16/2015  . Chronic left hip pain 12/16/2015    History reviewed. No pertinent surgical history.     Home Medications    Prior to Admission medications   Medication Sig Start Date End Date Taking? Authorizing Provider  atorvastatin (LIPITOR) 20 MG tablet Take 1 tablet (20 mg total) by mouth daily. 04/20/17   Hoy RegisterNewlin, Enobong, MD  diclofenac sodium (VOLTAREN) 1 % GEL Apply 4 g topically 4 (four) times daily. 06/11/17   Hedges, Tinnie GensJeffrey, PA-C  ergocalciferol (DRISDOL) 50000 units capsule Take 1 capsule (50,000 Units total) by mouth once a week. 04/20/17   Hoy RegisterNewlin, Enobong, MD  hydrOXYzine (ATARAX/VISTARIL) 25 MG tablet Take 1 tablet (25 mg total) by mouth every 6 (six) hours. 06/17/17   Bethel BornGekas, Kelly Marie, PA-C  phenytoin (DILANTIN) 300 MG ER capsule Take 1 capsule (300 mg total) by mouth at bedtime. 06/21/17   Azalia Bilisampos, Kammie Scioli, MD    Family History Family History  Problem Relation Age of Onset  . Heart failure Mother   . Diabetes Father     Social History Social History   Tobacco Use  . Smoking status: Never Smoker  . Smokeless tobacco: Never Used  Substance Use Topics  . Alcohol use: No  . Drug use: No     Allergies   Patient has no known allergies.   Review of Systems Review of Systems  Constitutional: Negative for activity change.  HENT:  Negative for congestion.   Respiratory: Negative for chest tightness and shortness of breath.   Cardiovascular: Negative for chest pain.  Gastrointestinal: Negative for abdominal pain.  Genitourinary: Negative for difficulty urinating.     Physical Exam Updated Vital Signs BP (!) 156/79 (BP Location: Right Arm)   Pulse 66   Temp 98.3 F (36.8 C) (Oral)   Resp 20   Ht 5\' 9"  (1.753 m)   Wt 95.3 kg (210 lb)   SpO2 100%   BMI 31.01 kg/m   Physical Exam  Constitutional: He is oriented to person, place, and time. He appears well-developed and well-nourished.  HENT:  Head: Normocephalic.  Eyes: EOM are normal.  Neck: Normal range of motion.  Pulmonary/Chest: Effort normal.  Abdominal: He exhibits no distension.  Musculoskeletal: Normal range of motion.  Neurological: He is alert and oriented to person, place, and time.  Psychiatric: He has a normal mood and affect.  Nursing note and vitals reviewed.    ED Treatments / Results  Labs (all labs ordered are listed, but only abnormal results are displayed) Labs Reviewed - No data to display  EKG  EKG Interpretation None       Radiology No results found.  Procedures Procedures (including critical care time)  Medications Ordered in ED Medications - No data to display   Initial Impression / Assessment and  Plan / ED Course  I have reviewed the triage vital signs and the nursing notes.  Pertinent labs & imaging results that were available during my care of the patient were reviewed by me and considered in my medical decision making (see chart for details).     Medication refill  Final Clinical Impressions(s) / ED Diagnoses   Final diagnoses:  Seizure disorder [facility])  Medication refill    ED Discharge Orders        Ordered    phenytoin (DILANTIN) 300 MG ER capsule  Daily at bedtime     06/21/17 1610       Azalia Bilis, MD 06/21/17 [phone]

## 2017-06-21 NOTE — ED Notes (Signed)
Patient left at this time with all belongings. 

## 2017-06-21 NOTE — ED Triage Notes (Signed)
Pt reports arthritis pain for over six months in knees, states has used meloxicam or asper cream in the past.

## 2017-06-26 ENCOUNTER — Ambulatory Visit: Payer: Self-pay

## 2017-06-26 DIAGNOSIS — M6281 Muscle weakness (generalized): Secondary | ICD-10-CM

## 2017-06-26 DIAGNOSIS — M25552 Pain in left hip: Secondary | ICD-10-CM

## 2017-06-26 DIAGNOSIS — R262 Difficulty in walking, not elsewhere classified: Secondary | ICD-10-CM

## 2017-06-26 DIAGNOSIS — M25651 Stiffness of right hip, not elsewhere classified: Secondary | ICD-10-CM

## 2017-06-26 DIAGNOSIS — M25652 Stiffness of left hip, not elsewhere classified: Secondary | ICD-10-CM

## 2017-06-26 DIAGNOSIS — M25551 Pain in right hip: Secondary | ICD-10-CM

## 2017-06-26 NOTE — Therapy (Signed)
Michael Shea, [place], [address] Phone: [phone]   Fax:  [phone]  Physical Therapy Treatment  Patient Details  Name: Michael Shea MRN: [id number] Date of Birth: [birth year] Referring Provider: Frankey Shown, MD   Encounter Date: 06/26/2017  PT End of Session - 06/26/17 1554    Visit Number  10    Number of Visits  12    Date for PT Re-Evaluation  07/04/17    Authorization Type  CAFA - need new dates ( per patient through 11/09/2017 )     PT Start Time  0347    PT Stop Time  0430    PT Time Calculation (min)  43 min    Activity Tolerance  Patient tolerated treatment well    Behavior During Therapy  [facility] for tasks assessed/performed       Past Medical History:  Diagnosis Date  . Arthritis   . Seizures ([facility])   . Sleep walking     History reviewed. No pertinent surgical history.  There were no vitals filed for this visit.  Subjective Assessment - 06/26/17 1555    Currently in Pain?  Yes    Pain Score  4     Pain Location  Hip    Pain Orientation  Left    Pain Descriptors / Indicators  Aching    Pain Type  Chronic pain    Pain Radiating Towards  groin    Pain Onset  More than a month ago    Pain Frequency  Constant    Aggravating Factors   walk and standing.     Pain Relieving Factors  rest meds    Multiple Pain Sites  No                      [facility] Adult PT Treatment/Exercise - 06/26/17 0001      Self-Care   Other Self-Care Comments   issued 3/8 in heel lift LT shoe He reports walks with more comfort      Knee/Hip Exercises: Stretches   Other Knee/Hip Stretches  Active asssitive ROM Lt leg . ROM except hip flexion really functional though limited expecially with hip flexion limiting adduct and IR.           Knee/Hip Exercises: Aerobic   Nustep  L5 x 6 min LE      Knee/Hip Exercises: Standing   Side Lunges  Left;15 reps black band      Knee/Hip Exercises: Seated   Long  Arc Quad  Left;20 reps    Long Arc Quad Weight  5 lbs.    Other Seated Knee/Hip Exercises  black band clamshell x 20    Sit to Sand  2 sets;10 reps;without UE support with ball squeeze      Knee/Hip Exercises: Supine   Bridges with Ball Squeeze  15 reps;1 set;Both    Bridges with Clamshell  Both;1 set;15 reps with adduction stretch between reps    Other Supine Knee/Hip Exercises  sitting hip IR squeezing ball between feet 2 x 10 holding 5 sec each      Knee/Hip Exercises: Sidelying   Hip ABduction  Strengthening;2 sets;10 reps      Moist Heat Therapy   Number Minutes Moist Heat  12 Minutes    Moist Heat Location  Hip ant             PT Education - 06/26/17 1634    Education provided  Yes  Education Details  Heel lift wear time 4-8 hours and remove for 2-4 hours and assess if lift helpful and if is keep in shoe full time.     Person(s) Educated  Patient    Methods  Explanation    Comprehension  Verbalized understanding       PT Short Term Goals - 06/13/17 1621      PT SHORT TERM GOAL #1   Title  He will be independent with initial HEP     Baseline  needs cues    Time  2    Period  Weeks    Status  Partially Met        PT Long Term Goals - 06/13/17 1620      PT LONG TERM GOAL #1   Title  He will be independent with all HEP issued    Baseline  needs frequent cues for form    Time  4    Period  Weeks    Status  On-going    Target Date  07/11/17      PT LONG TERM GOAL #2   Title  He will report Rt hip /back pain decreased 50% with walking to be able to walk 2 blocks    Time  4    Period  Weeks    Status  On-going    Target Date  07/11/17      PT LONG TERM GOAL #3   Title  He will be able to sit for > 30 min without increased pain    Time  4    Period  Weeks    Status  On-going    Target Date  07/11/17      PT LONG TERM GOAL #4   Title  He will report pain as intermittant in back and hip    Time  4    Period  Weeks    Status  On-going    Target  Date  07/11/17            Plan - 06/26/17 1554    Clinical Impression Statement  Pt feels he is better but mobility is same and ROM is not better at this point. Ortho will not do replacement at this time.  He reports he is doing HEP with bands. Will progress HEP as able , manual if needed x 2 visits then dischargelacement at this time    PT Treatment/Interventions  Moist Heat;Ultrasound;Iontophoresis '4mg'$ /ml Dexamethasone;Therapeutic exercise;Therapeutic activities;Manual techniques;Patient/family education;Dry needling;Passive range of motion    PT Next Visit Plan  manual therapy to hips/back; review HEP and progress as tolerated. If no progress continues then discuss D/C  Assess heel lift     PT Home Exercise Plan  Sit-stand, bridge, calf stretch, piriformis stretch, hamstring stretch, butterfly, hip flexor supine stretch,  clam with band    Consulted and Agree with Plan of Care  Patient       Patient will benefit from skilled therapeutic intervention in order to improve the following deficits and impairments:  Pain, Difficulty walking, Decreased activity tolerance, Decreased strength, Decreased range of motion, Increased muscle spasms, Postural dysfunction  Visit Diagnosis: Pain in right hip  Stiffness of right hip, not elsewhere classified  Difficulty in walking, not elsewhere classified  Stiffness of left hip, not elsewhere classified  Pain in left hip  Muscle weakness (generalized)     Problem List Patient Active Problem List   Diagnosis Date Noted  . Unilateral primary osteoarthritis, left hip 06/18/2017  .  Vitamin D deficiency 04/20/2017  . Osteoarthritis 10/09/2016  . Seizures ([place]) 12/16/2015  . Chronic left hip pain 12/16/2015    Darrel Hoover  PT 06/26/2017, 4:39 PM  [facility] [address] [place], [place], [address] Phone: [phone]   Fax:  [phone]  Name: Michael Shea MRN:  [id number] Date of Birth: 06/29/1955

## 2017-06-29 ENCOUNTER — Other Ambulatory Visit: Payer: Self-pay | Admitting: Family Medicine

## 2017-06-29 DIAGNOSIS — M175 Other unilateral secondary osteoarthritis of knee: Secondary | ICD-10-CM

## 2017-06-29 MED FILL — DICLOFENAC SODIUM 1% GEL: 1 | 6 days supply | Qty: 100 | Fill #0

## 2017-07-02 ENCOUNTER — Ambulatory Visit: Payer: Self-pay

## 2017-07-03 ENCOUNTER — Telehealth: Payer: Self-pay | Admitting: Family Medicine

## 2017-07-03 NOTE — Telephone Encounter (Signed)
Patient called and requested for an alternate of medication for his arteritis. Patient stated it is working slightly but he requested for something to help better. Please fu with patient at your earliest convenience and patient requested for a call.

## 2017-07-04 ENCOUNTER — Telehealth: Payer: Self-pay

## 2017-07-04 MED ORDER — MELOXICAM 7.5 MG PO TABS: 7.5000 mg | ORAL_TABLET | Freq: Every day | ORAL | 1 refills | Status: DC

## 2017-07-04 NOTE — Telephone Encounter (Signed)
I have sent a prescription for meloxicam to his pharmacy and I will need him to discontinue Voltaren gel because a combination of both can result in GI bleeding.

## 2017-07-04 NOTE — Telephone Encounter (Signed)
Patient was called and a voicemail was left informing patient to return phone call. 

## 2017-07-11 ENCOUNTER — Other Ambulatory Visit: Payer: Self-pay | Admitting: Family Medicine

## 2017-07-11 DIAGNOSIS — E559 Vitamin D deficiency, unspecified: Secondary | ICD-10-CM

## 2017-07-11 MED FILL — VIT D2 1.25 MG (50,000 UNIT: 1.25 MG | 28 days supply | Qty: 4 | Fill #3

## 2017-07-11 MED FILL — DICLOFENAC SODIUM 1% GEL: 1 | 6 days supply | Qty: 100 | Fill #1

## 2017-07-12 ENCOUNTER — Ambulatory Visit: Payer: Self-pay | Attending: Orthopaedic Surgery

## 2017-07-12 ENCOUNTER — Ambulatory Visit: Payer: Self-pay

## 2017-07-12 DIAGNOSIS — M6281 Muscle weakness (generalized): Secondary | ICD-10-CM | POA: Insufficient documentation

## 2017-07-12 DIAGNOSIS — R262 Difficulty in walking, not elsewhere classified: Secondary | ICD-10-CM | POA: Insufficient documentation

## 2017-07-12 DIAGNOSIS — M25552 Pain in left hip: Secondary | ICD-10-CM | POA: Insufficient documentation

## 2017-07-12 DIAGNOSIS — M25651 Stiffness of right hip, not elsewhere classified: Secondary | ICD-10-CM | POA: Insufficient documentation

## 2017-07-12 DIAGNOSIS — M25551 Pain in right hip: Secondary | ICD-10-CM | POA: Insufficient documentation

## 2017-07-12 DIAGNOSIS — M25652 Stiffness of left hip, not elsewhere classified: Secondary | ICD-10-CM | POA: Insufficient documentation

## 2017-07-12 NOTE — Therapy (Addendum)
Michael Shea, [place], [address] Phone: [phone]   Fax:  [phone]  Physical Therapy Treatment/Discharge  Patient Details  Name: Michael Shea MRN: [id number] Date of Birth: 1955-05-26 Referring Provider: Frankey Shown, MD   Encounter Date: 07/12/2017  PT End of Session - 07/12/17 1604    Visit Number  11    Number of Visits  12    Date for PT Re-Evaluation  07/04/17    Authorization Type  CAFA - need new dates ( per patient through 11/09/2017 )     PT Start Time  0345    PT Stop Time  0430    PT Time Calculation (min)  45 min    Activity Tolerance  Patient tolerated treatment well    Behavior During Therapy  [facility] for tasks assessed/performed       Past Medical History:  Diagnosis Date  . Arthritis   . Seizures ([facility] Shea)   . Sleep walking     No past surgical history on file.  There were no vitals filed for this visit.  Subjective Assessment - 07/12/17 1556    Subjective  today pain 4/10    Currently in Pain?  Yes    Pain Score  4     Pain Location  Hip    Pain Orientation  Left    Pain Descriptors / Indicators  Aching    Pain Type  Chronic pain    Pain Radiating Towards  groin    Pain Onset  More than a month ago    Pain Frequency  Constant    Aggravating Factors   walk /stand    Pain Relieving Factors  rest/ meds    Multiple Pain Sites  No                      [facility] Adult PT Treatment/Exercise - 07/12/17 0001      Knee/Hip Exercises: Stretches   Hip Flexor Stretch  Left;1 rep;60 seconds supine    Other Knee/Hip Stretches  butterfly 45 sec, feet together      Knee/Hip Exercises: Aerobic   Nustep  L5 x 6 min LE      Knee/Hip Exercises: Supine   Other Supine Knee/Hip Exercises  rotation o both LE to work on hip rotation with feet  12 -15 inches apart      Moist Heat Therapy   Number Minutes Moist Heat  12 Minutes    Moist Heat Location  Hip anterior       Also  worked on bridging , hip flexion and adduction and hip flexor  stretching . All with understanding of these HEP issued. He reported at end that he had no exercises on paper from [place]. I  with retieved the AVS for visits and issued this to him. It appears he is not doing HEP and ,ay not be able to keep track of tham.         PT Short Term Goals - 07/12/17 1641      PT SHORT TERM GOAL #1   Title  He will be independent with initial HEP     Baseline  reports he was not given HEP    Status  On-going        PT Long Term Goals - 06/13/17 1620      PT LONG TERM GOAL #1   Title  He will be independent with all HEP issued    Baseline  needs frequent cues for form    Time  4    Period  Weeks    Status  On-going    Target Date  07/11/17      PT LONG TERM GOAL #2   Title  He will report Rt hip /back pain decreased 50% with walking to be able to walk 2 blocks    Time  4    Period  Weeks    Status  On-going    Target Date  07/11/17      PT LONG TERM GOAL #3   Title  He will be able to sit for > 30 min without increased pain    Time  4    Period  Weeks    Status  On-going    Target Date  07/11/17      PT LONG TERM GOAL #4   Title  He will report pain as intermittant in back and hip    Time  4    Period  Weeks    Status  On-going    Target Date  07/11/17            Plan - 07/12/17 1605    Clinical Impression Statement  No changes . He even thought he had lost the heel lift but it was still underfoot bed , No changes and he wants another session so as promised will have him schedule 1 more visit.  He was reissued HEP from previous PT sessions as he stated he was not given these before. One more visit then discharge    PT Treatment/Interventions  Moist Heat;Ultrasound;Iontophoresis 62m/ml Dexamethasone;Therapeutic exercise;Therapeutic activities;Manual techniques;Patient/family education;Dry needling;Passive range of motion    PT Next Visit Plan  Review HEP again and see if he  can replicate    PT Home Exercise Plan  Sit-stand, bridge, calf stretch, piriformis stretch, hamstring stretch, butterfly, hip flexor supine stretch,  clam with band    Consulted and Agree with Plan of Care  Patient       Patient will benefit from skilled therapeutic intervention in order to improve the following deficits and impairments:  Pain, Difficulty walking, Decreased activity tolerance, Decreased strength, Decreased range of motion, Increased muscle spasms, Postural dysfunction  Visit Diagnosis: Pain in right hip  Stiffness of right hip, not elsewhere classified  Difficulty in walking, not elsewhere classified  Stiffness of left hip, not elsewhere classified  Pain in left hip  Muscle weakness (generalized)     Problem List Patient Active Problem List   Diagnosis Date Noted  . Unilateral primary osteoarthritis, left hip 06/18/2017  . Vitamin D deficiency 04/20/2017  . Osteoarthritis 10/09/2016  . [place]  . Chronic left hip pain 12/16/2015    CDarrel Shea PT 07/12/2017, 4:44 PM  [facility] [address]Phone: [phone]  Fax:  [phone] Name: ATyrus WilmsMRN: [id number] of Birth: [birth year] PHYSICAL THERAPY DISCHARGE SUMMARY  Visits from Start of Care: 11  Current functional level related to goals / functional outcomes: See above . He has made no improvement and appears to need THA .  He has canceled many visits  He canceled his last visit stating he will see Dr BNinfa Linden It does not appear hea has done this  Remaining deficits: See above   Education / Equipment: HEP Plan:  Patient goals were not met. Patient is being discharged due to not returning since the last visit.  ?????    Michael Shea PT     09/13/17

## 2017-07-25 ENCOUNTER — Ambulatory Visit: Payer: Self-pay | Admitting: Physical Therapy

## 2017-08-07 MED FILL — PHENYTOIN SOD EXT 300 MG CA: 300 | 30 days supply | Qty: 30 | Fill #2

## 2017-08-07 MED FILL — VIT D2 1.25 MG (50,000 UNIT: 1.25 MG | 13 days supply | Qty: 2 | Fill #4

## 2017-08-08 ENCOUNTER — Ambulatory Visit: Payer: Self-pay | Admitting: Physical Therapy

## 2017-08-14 MED FILL — DICLOFENAC SODIUM 1% GEL: 1 | 6 days supply | Qty: 100 | Fill #2

## 2017-08-15 ENCOUNTER — Telehealth: Payer: Self-pay | Admitting: Family Medicine

## 2017-08-15 ENCOUNTER — Other Ambulatory Visit: Payer: Self-pay | Admitting: Family Medicine

## 2017-08-15 DIAGNOSIS — E559 Vitamin D deficiency, unspecified: Secondary | ICD-10-CM

## 2017-08-15 NOTE — Telephone Encounter (Signed)
Patient stopped by the office today to pick up his vitamin d medication, and he was only given 2 pills. Patient wondered why he was only given 2 pills and not his normal quantity. Please fu at your earliest convenience.

## 2017-08-20 NOTE — Telephone Encounter (Signed)
Patient was called and informed of medication being ready at pharmacy onsite.

## 2017-08-21 ENCOUNTER — Ambulatory Visit: Payer: Self-pay

## 2017-08-27 ENCOUNTER — Other Ambulatory Visit: Payer: Self-pay | Admitting: Family Medicine

## 2017-08-27 DIAGNOSIS — M175 Other unilateral secondary osteoarthritis of knee: Secondary | ICD-10-CM

## 2017-08-27 NOTE — Telephone Encounter (Signed)
Please advise on RF for Voltaren Gel in addition to Meloxicam. Per last note, pt has increase risk for GI bleed if taking both by mouth.

## 2017-08-28 ENCOUNTER — Ambulatory Visit: Payer: Self-pay

## 2017-09-05 ENCOUNTER — Other Ambulatory Visit: Payer: Self-pay | Admitting: Family Medicine

## 2017-09-05 DIAGNOSIS — M175 Other unilateral secondary osteoarthritis of knee: Secondary | ICD-10-CM

## 2017-09-05 MED FILL — PHENYTOIN SOD EXT 300 MG CA: 300 | 30 days supply | Qty: 30 | Fill #3

## 2017-09-05 MED FILL — VIT D2 1.25 MG (50,000 UNIT: 1.25 MG | 28 days supply | Qty: 4 | Fill #1

## 2017-09-06 ENCOUNTER — Other Ambulatory Visit: Payer: Self-pay | Admitting: Family Medicine

## 2017-09-06 DIAGNOSIS — M175 Other unilateral secondary osteoarthritis of knee: Secondary | ICD-10-CM

## 2017-09-07 ENCOUNTER — Other Ambulatory Visit: Payer: Self-pay | Admitting: Family Medicine

## 2017-09-07 DIAGNOSIS — M175 Other unilateral secondary osteoarthritis of knee: Secondary | ICD-10-CM

## 2017-09-14 ENCOUNTER — Other Ambulatory Visit: Payer: Self-pay | Admitting: Family Medicine

## 2017-09-14 DIAGNOSIS — M175 Other unilateral secondary osteoarthritis of knee: Secondary | ICD-10-CM

## 2017-09-14 MED ORDER — DICLOFENAC SODIUM 1 % TD GEL: 4.0000 g | Freq: Four times a day (QID) | TRANSDERMAL | 2 refills | Status: AC

## 2017-09-27 ENCOUNTER — Other Ambulatory Visit: Payer: Self-pay | Admitting: Family Medicine

## 2017-09-27 DIAGNOSIS — R569 Unspecified convulsions: Secondary | ICD-10-CM

## 2017-09-27 MED FILL — DICLOFENAC SODIUM 1% GEL: 1 | 6 days supply | Qty: 100 | Fill #0

## 2017-10-03 MED FILL — PHENYTOIN SOD EXT 300 MG CA: 300 | 30 days supply | Qty: 30 | Fill #0

## 2017-10-24 MED FILL — VIT D2 1.25 MG (50,000 UNIT: 1.25 MG | 28 days supply | Qty: 4 | Fill #2

## 2017-10-31 ENCOUNTER — Ambulatory Visit: Payer: Self-pay | Attending: Family Medicine

## 2018-02-15 ENCOUNTER — Emergency Department (HOSPITAL_COMMUNITY)
Admission: EM | Admit: 2018-02-15 | Discharge: 2018-02-16 | Disposition: A | Discharge status: Home / Self Care | Payer: Medicaid Other | Attending: Emergency Medicine | Admitting: Emergency Medicine

## 2018-02-15 ENCOUNTER — Encounter (HOSPITAL_COMMUNITY): Payer: Self-pay | Admitting: Emergency Medicine

## 2018-02-15 ENCOUNTER — Other Ambulatory Visit: Payer: Self-pay

## 2018-02-15 DIAGNOSIS — Z79899 Other long term (current) drug therapy: Secondary | ICD-10-CM | POA: Diagnosis not present

## 2018-02-15 DIAGNOSIS — G8929 Other chronic pain: Secondary | ICD-10-CM | POA: Diagnosis not present

## 2018-02-15 DIAGNOSIS — M25552 Pain in left hip: Secondary | ICD-10-CM | POA: Diagnosis not present
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
## 2018-02-16 MED ORDER — ACETAMINOPHEN 325 MG PO TABS
650.0000 mg | ORAL_TABLET | Freq: Once | ORAL | Status: AC
  Administered 2018-02-16: 650 mg via ORAL
  Filled 2018-02-16: qty 2

## 2018-02-16 MED ORDER — ACETAMINOPHEN 325 MG PO TABS: 650.0000 mg | ORAL_TABLET | Freq: Four times a day (QID) | ORAL | 0 refills | Status: AC | PRN

## 2018-02-16 NOTE — ED Provider Notes (Signed)
[facility] EMERGENCY DEPARTMENT Provider Note   CSN: [id number] Arrival date & time: 02/15/18  2322     History   Chief Complaint Chief Complaint  Patient presents with  . Hip Pain    HPI Michael Shea is a 62 y.o. male with a past medical history of arthritis, chronic left hip pain after injury sustained in 2015, who presents to ED for ongoing left hip pain.  Patient appears to be a poor historian and difficult to understand, so unable to obtain accurate history.  However, he states that he has had this pain since 2015.  He will sometimes do home physical therapy and take ibuprofen which helps.  He is requesting a referral to an orthopedic doctor for a possible cortisone shot in his hip.  Denies any injuries or falls, inability to walk, numbness in arms or legs, fever.  HPI  Past Medical History:  Diagnosis Date  . Arthritis   . Seizures (HCC)   . Sleep walking     Patient Active Problem List   Diagnosis Date Noted  . Unilateral primary osteoarthritis, left hip 06/18/2017  . Vitamin D deficiency 04/20/2017  . Osteoarthritis 10/09/2016  . Seizures (HCC) 12/16/2015  . Chronic left hip pain 12/16/2015    History reviewed. No pertinent surgical history.      Home Medications    Prior to Admission medications   Medication Sig Start Date End Date Taking? Authorizing Provider  acetaminophen (TYLENOL) 325 MG tablet Take 2 tablets (650 mg total) by mouth every 6 (six) hours as needed. 02/16/18   Lionardo Haze, PA-C  atorvastatin (LIPITOR) 20 MG tablet Take 1 tablet (20 mg total) by mouth daily. 04/20/17   Hoy Register, MD  diclofenac sodium (VOLTAREN) 1 % GEL Apply 4 g topically 4 (four) times daily. 09/14/17   Hoy Register, MD  ergocalciferol (DRISDOL) 50000 units capsule Take 1 capsule (50,000 Units total) by mouth once a week. 04/20/17   Hoy Register, MD  hydrOXYzine (ATARAX/VISTARIL) 25 MG tablet Take 1 tablet (25 mg total) by mouth every 6  (six) hours. 06/17/17   Bethel Born, PA-C  phenytoin (DILANTIN) 300 MG ER capsule Take 1 capsule (300 mg total) by mouth at bedtime. 06/21/17   Azalia Bilis, MD  phenytoin (DILANTIN) 300 MG ER capsule TAKE 1 CAPSULE (300 MG TOTAL) BY MOUTH AT BEDTIME. 09/27/17   Hoy Register, MD    Family History Family History  Problem Relation Age of Onset  . Heart failure Mother   . Diabetes Father     Social History Social History   Tobacco Use  . Smoking status: Never Smoker  . Smokeless tobacco: Never Used  Substance Use Topics  . Alcohol use: No  . Drug use: No     Allergies   Patient has no known allergies.   Review of Systems Review of Systems  Constitutional: Negative for chills and fever.  Musculoskeletal: Positive for arthralgias. Negative for myalgias.  Neurological: Negative for weakness and numbness.     Physical Exam Updated Vital Signs BP (!) 171/84 (BP Location: Right Arm)   Pulse 85   Temp 98.3 F (36.8 C) (Oral)   Resp 15   Ht 5\' 9"  (1.753 m)   Wt 99.8 kg   SpO2 98%   BMI 32.49 kg/m   Physical Exam  Constitutional: He appears well-developed and well-nourished. No distress.  HENT:  Head: Normocephalic and atraumatic.  Eyes: Conjunctivae and EOM are normal. No scleral icterus.  Neck:  Normal range of motion.  Pulmonary/Chest: Effort normal. No respiratory distress.  Musculoskeletal: Normal range of motion. He exhibits no edema, tenderness or deformity.  No tenderness palpation of the left hip.  Ambulatory with normal gait.  Able to externally and internally rotate hip without difficulty.  No lumbar spinal tenderness palpation at the midline.  2+ DP pulse palpated bilaterally.  Strength 5/5 in bilateral lower extremities.  Sensation intact to light touch.  Neurological: He is alert.  Skin: No rash noted. He is not diaphoretic.  Psychiatric: He has a normal mood and affect.  Nursing note and vitals reviewed.    ED Treatments / Results   Labs (all labs ordered are listed, but only abnormal results are displayed) Labs Reviewed - No data to display  EKG None  Radiology No results found.  Procedures Procedures (including critical care time)  Medications Ordered in ED Medications  acetaminophen (TYLENOL) tablet 650 mg (650 mg Oral Given 02/16/18 0013)     Initial Impression / Assessment and Plan / ED Course  I have reviewed the triage vital signs and the nursing notes.  Pertinent labs & imaging results that were available during my care of the patient were reviewed by me and considered in my medical decision making (see chart for details).     62 year old male presents to ED for his chronic left hip pain.  States that he sustained an injury in 2015 and has had pain since then.  He has improvement with home physical therapy, physical therapy at rehab center and NSAIDs.  However, today he is requesting a cortisone shot or a referral to orthopedics to complete this.  He denies any new injuries or falls.  Patient appears to be a poor historian.  However, there is no reproducible tenderness palpation of the hip, no deformity or changes to gait noted.  He remains ambulatory.  X-ray done earlier this year shows osteoarthritic changes of the left hip and degenerative disc disease of the lower lumbar spine.  This has been present since his injury.  Will give Tylenol as needed for pain.  Will give referral to orthopedics.  Advised to return to ED for any severe worsening symptoms.  Portions of this note were generated with Scientist, clinical (histocompatibility and immunogenetics). Dictation errors may occur despite best attempts at proofreading.   Final Clinical Impressions(s) / ED Diagnoses   Final diagnoses:  Chronic left hip pain    ED Discharge Orders         Ordered    acetaminophen (TYLENOL) 325 MG tablet  Every 6 hours PRN     02/16/18 0011           Dietrich Pates, PA-C 02/16/18 0016    Benjiman Core, MD 02/16/18 [phone]

## 2018-02-16 NOTE — Discharge Instructions (Signed)
Return to ED for worsening symptoms, injuries or falls, chest pain, shortness of breath, numbness in arms or legs.

## 2018-02-25 ENCOUNTER — Ambulatory Visit: Payer: Medicaid Other

## 2018-03-06 ENCOUNTER — Ambulatory Visit: Payer: Medicaid Other | Admitting: Family Medicine

## 2018-08-10 NOTE — Telephone Encounter (Signed)
error 

## 2018-11-25 IMAGING — CR DG HIP (WITH OR WITHOUT PELVIS) 2-3V*R*
3 series · 3 of 3 positions shown · non-contrast
Comparison: 02/29/2016

CLINICAL DATA: Right posterior hip pain which is worsening

EXAM:
DG HIP (WITH OR WITHOUT PELVIS) 2-3V RIGHT

[pelvis ap]
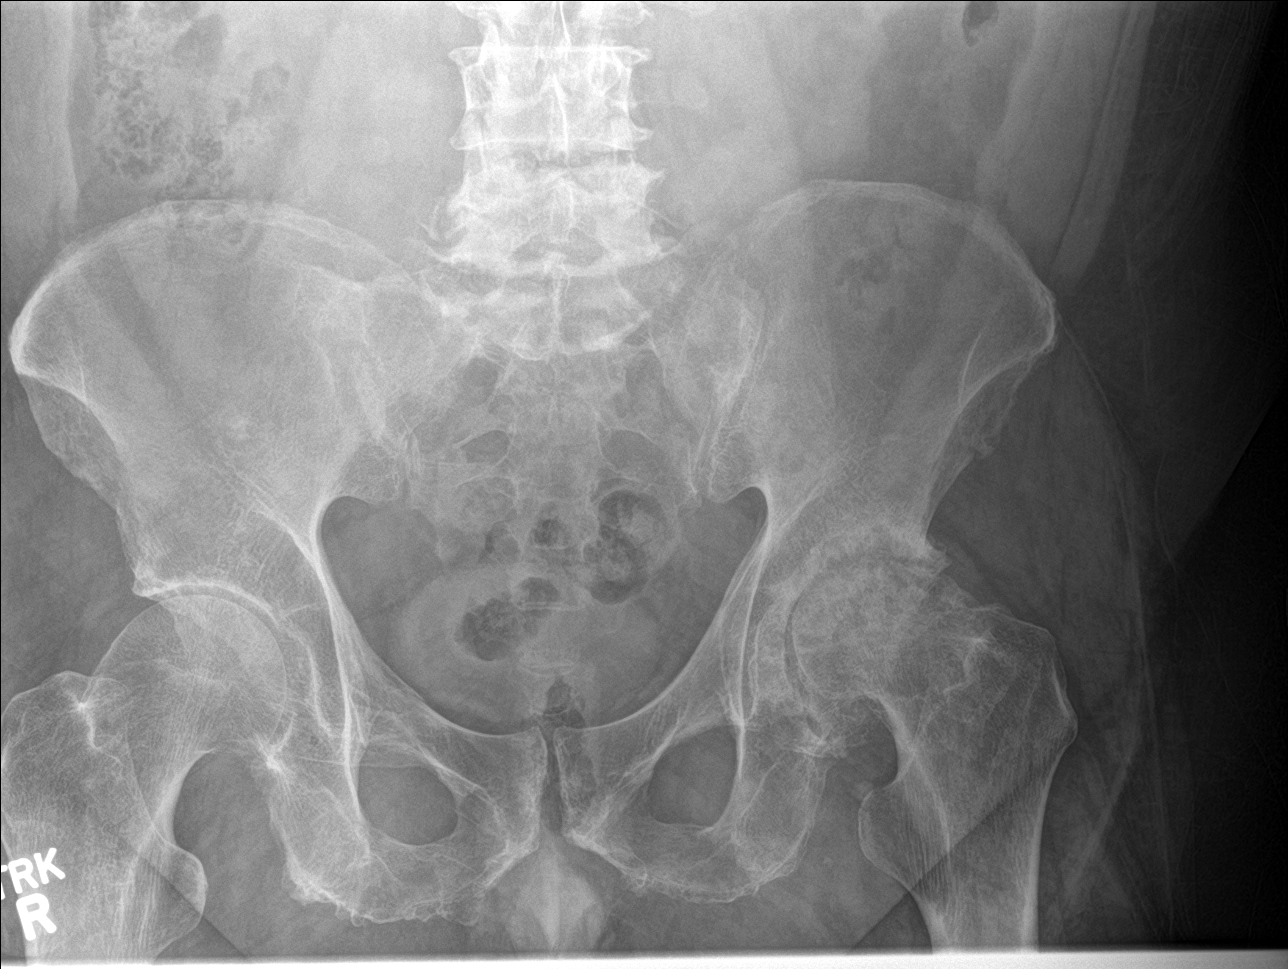

[hip ap]
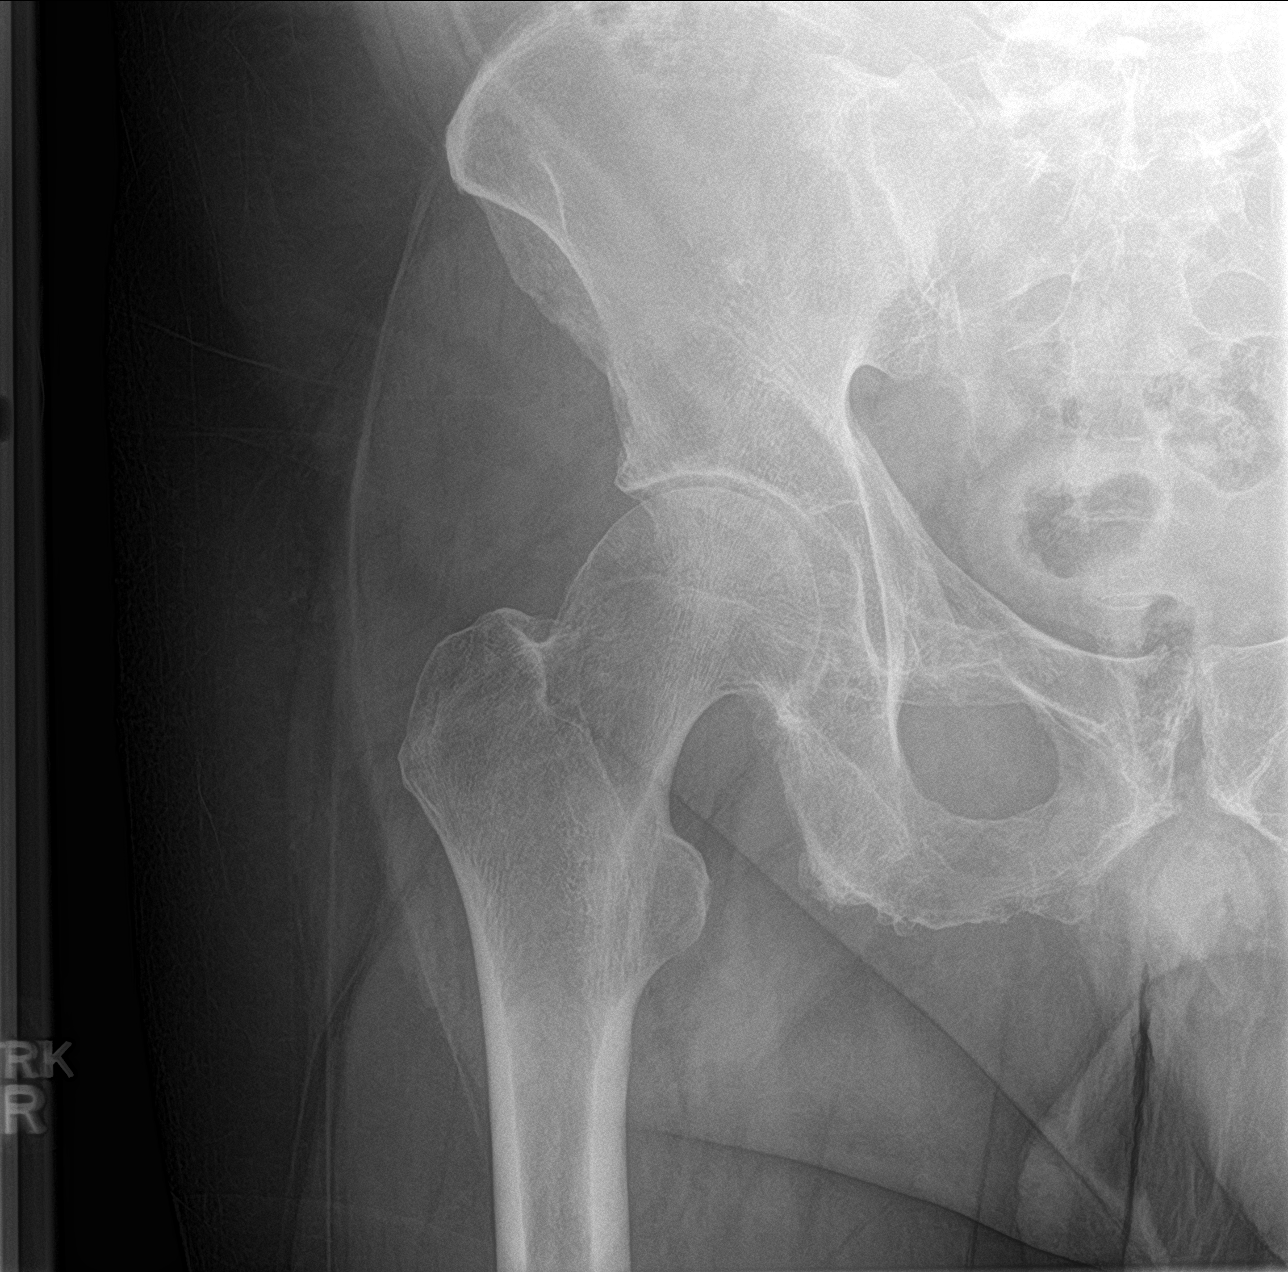

[hip lat]
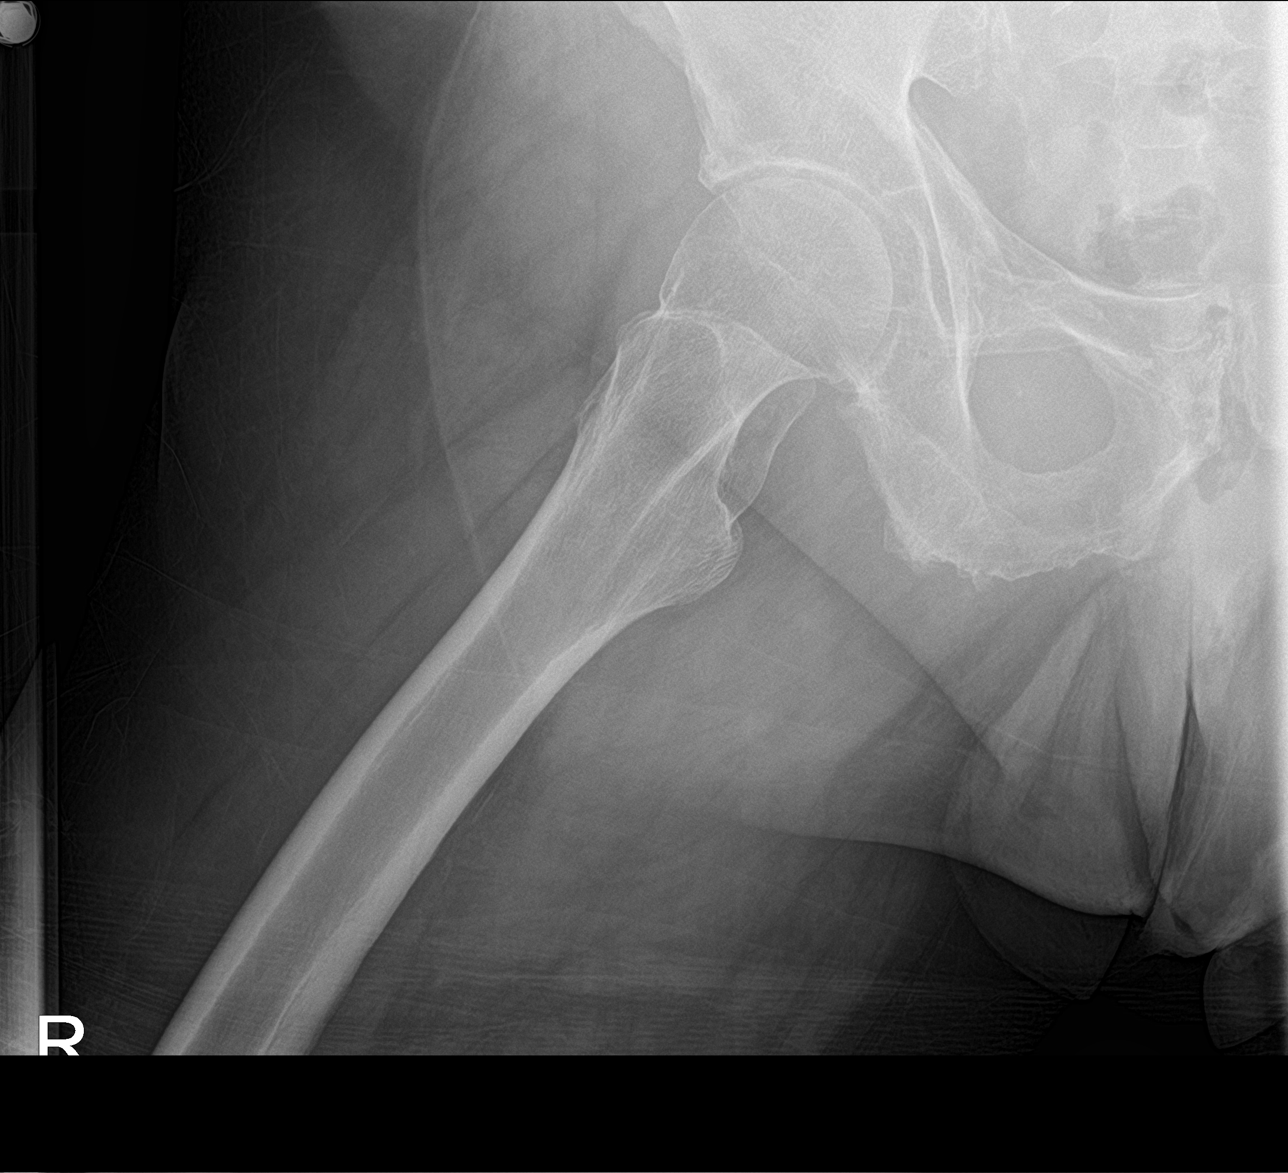

[3 of 3 positions shown; findings below may reference images not displayed]

FINDINGS: Pelvis view shows advanced degenerative arthropathy of the left hip
with complete loss of joint space, acetabular flattening, sclerosis
and cyst formation. This is probably worsened somewhat since last
year. Right hip shows mild joint space narrowing without bone
changes of the acetabulum or femoral head. Bridging osteophytes are
noted at the superior right sacroiliac joint. There are lower lumbar
degenerative changes.
IMPRESSION: Mild joint space narrowing of the right hip without bony changes.
Similar appearance to the study January 2016.

## 2019-01-31 IMAGING — DX DG HIP (WITH OR WITHOUT PELVIS) 2-3V*L*
3 series · 3 of 3 positions shown · non-contrast
Comparison: 02/29/2016

CLINICAL DATA: Left hip pain

EXAM:
DG HIP (WITH OR WITHOUT PELVIS) 2-3V LEFT

[pelvis ap]
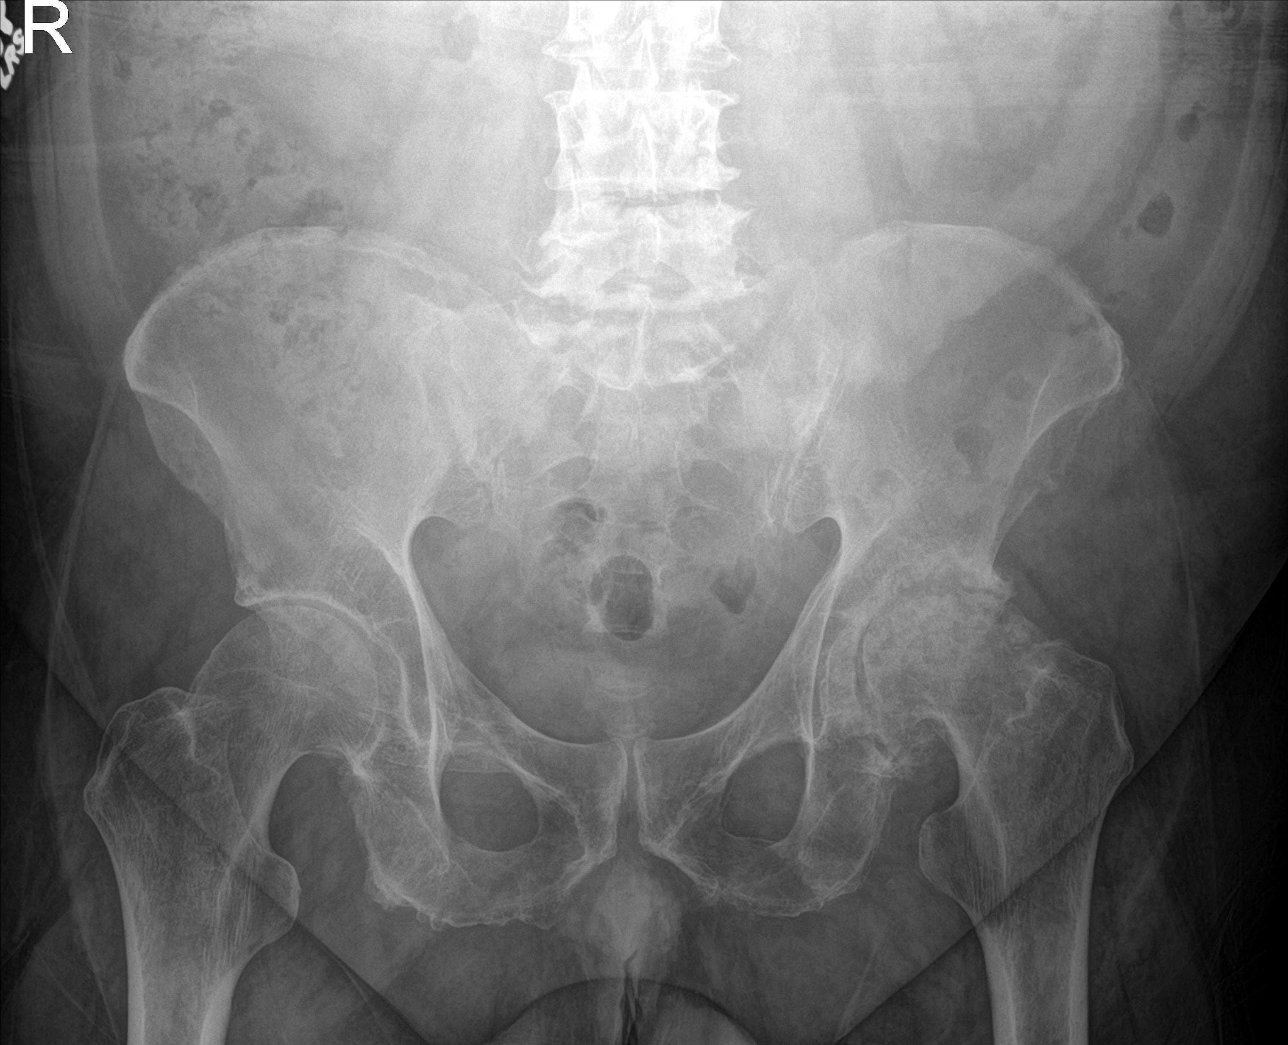

[hip ap]
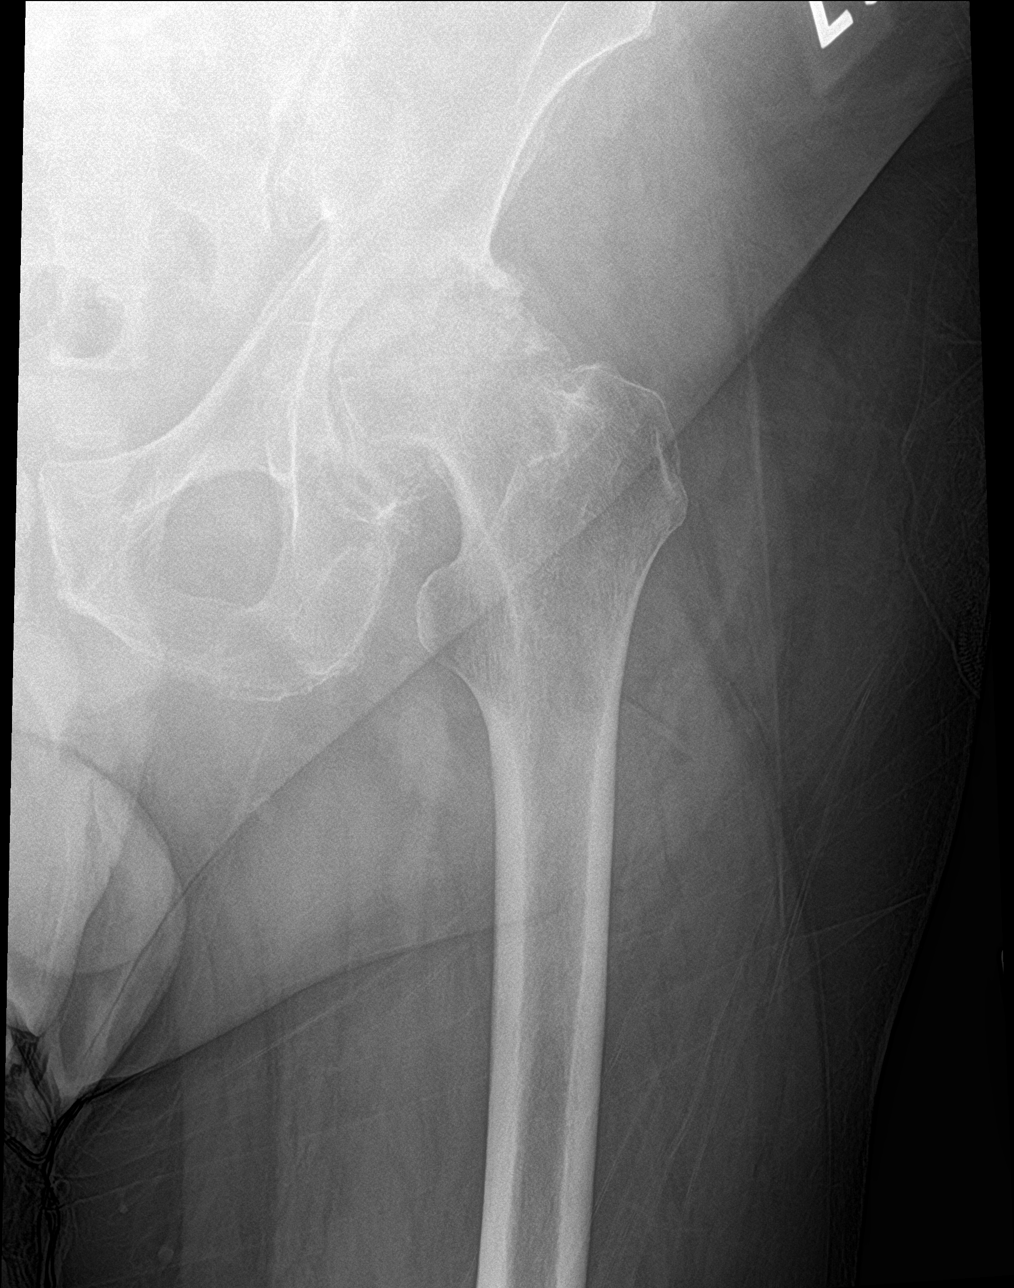

[hip lat]
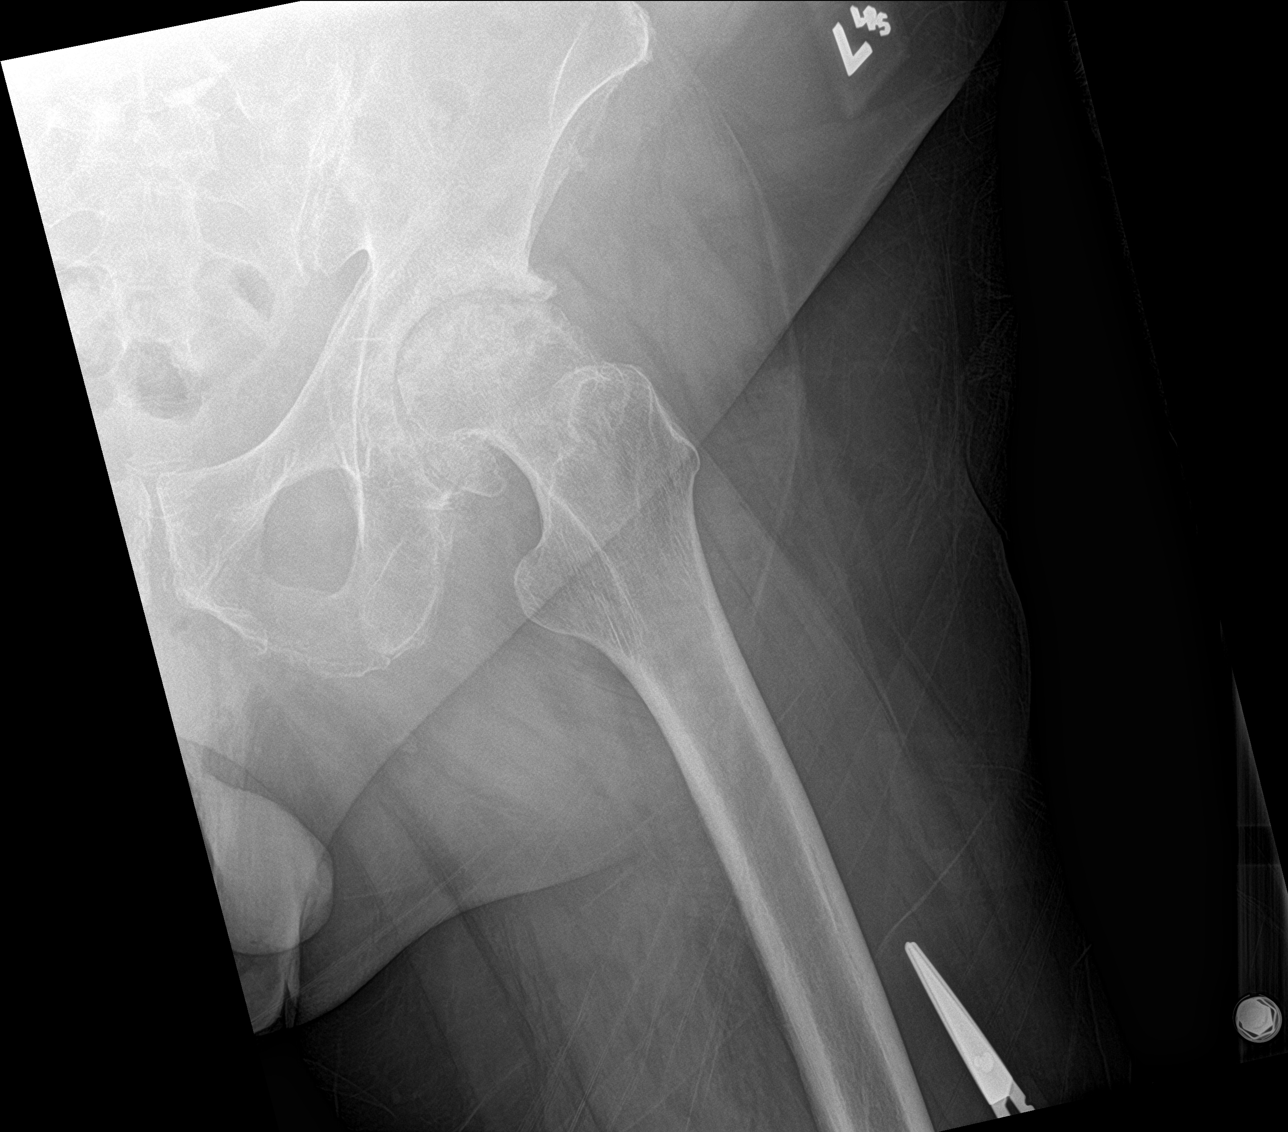

[3 of 3 positions shown; findings below may reference images not displayed]

FINDINGS: Redemonstration of marked osteoarthritic change about the left hip
with flattened weight-bearing portion of the left femoral head,
subchondral sclerosis and degenerative cystic change involving the
acetabulum and left femoral head. No acute fracture. Lower lumbar
degenerative disc disease L4-5 and L5-S1 with associated facet
arthropathy. Chronic stable moderate joint space narrowing of the
native right hip. No acute pelvic fracture. No diastasis.
IMPRESSION: Marked osteoarthritic joint space narrowing, subchondral sclerosis
and degenerative cystic change of the left hip with flattened
appearance of the left femoral head.

Lower lumbar degenerative disc and facet arthropathy L4 through S1.
